# Patient Record
Sex: Female | Born: 1945 | Race: White | Hispanic: No | Marital: Married | Smoking: Former smoker
Health system: Southern US, Community
[De-identification: ages and names within clinical notes are randomized; demographics above are authoritative.]

## PROBLEM LIST (undated history)

## (undated) DIAGNOSIS — R002 Palpitations: Secondary | ICD-10-CM

## (undated) DIAGNOSIS — J449 Chronic obstructive pulmonary disease, unspecified: Secondary | ICD-10-CM

## (undated) DIAGNOSIS — Z8489 Family history of other specified conditions: Secondary | ICD-10-CM

## (undated) DIAGNOSIS — D473 Essential (hemorrhagic) thrombocythemia: Secondary | ICD-10-CM

## (undated) DIAGNOSIS — M7711 Lateral epicondylitis, right elbow: Secondary | ICD-10-CM

## (undated) DIAGNOSIS — R42 Dizziness and giddiness: Secondary | ICD-10-CM

## (undated) DIAGNOSIS — D75839 Thrombocytosis, unspecified: Secondary | ICD-10-CM

## (undated) DIAGNOSIS — I499 Cardiac arrhythmia, unspecified: Secondary | ICD-10-CM

## (undated) DIAGNOSIS — M810 Age-related osteoporosis without current pathological fracture: Secondary | ICD-10-CM

## (undated) DIAGNOSIS — Z972 Presence of dental prosthetic device (complete) (partial): Secondary | ICD-10-CM

## (undated) DIAGNOSIS — I4891 Unspecified atrial fibrillation: Secondary | ICD-10-CM

## (undated) DIAGNOSIS — E559 Vitamin D deficiency, unspecified: Secondary | ICD-10-CM

## (undated) DIAGNOSIS — J45909 Unspecified asthma, uncomplicated: Secondary | ICD-10-CM

## (undated) DIAGNOSIS — I341 Nonrheumatic mitral (valve) prolapse: Secondary | ICD-10-CM

## (undated) DIAGNOSIS — I7300 Raynaud's syndrome without gangrene: Secondary | ICD-10-CM

## (undated) HISTORY — PX: APPENDECTOMY: SHX54

## (undated) HISTORY — DX: Unspecified asthma, uncomplicated: J45.909

## (undated) HISTORY — DX: Nonrheumatic mitral (valve) prolapse: I34.1

## (undated) HISTORY — PX: COLONOSCOPY: SHX174

## (undated) HISTORY — DX: Essential (hemorrhagic) thrombocythemia: D47.3

## (undated) HISTORY — PX: TONSILLECTOMY: SUR1361

## (undated) HISTORY — PX: BLEPHAROPLASTY: SUR158

## (undated) HISTORY — DX: Vitamin D deficiency, unspecified: E55.9

## (undated) HISTORY — DX: Thrombocytosis, unspecified: D75.839

## (undated) HISTORY — PX: TUBAL LIGATION: SHX77

## (undated) HISTORY — DX: Palpitations: R00.2

---

## 2007-03-27 ENCOUNTER — Ambulatory Visit: Payer: Self-pay | Admitting: Internal Medicine

## 2008-06-12 ENCOUNTER — Ambulatory Visit: Payer: Self-pay | Admitting: Internal Medicine

## 2009-08-04 ENCOUNTER — Ambulatory Visit: Payer: Self-pay | Admitting: Nurse Practitioner

## 2010-09-08 ENCOUNTER — Ambulatory Visit: Payer: Self-pay | Admitting: Nurse Practitioner

## 2011-07-01 ENCOUNTER — Ambulatory Visit: Payer: Self-pay | Admitting: Unknown Physician Specialty

## 2011-09-30 ENCOUNTER — Ambulatory Visit: Payer: Self-pay | Admitting: Nurse Practitioner

## 2012-07-16 ENCOUNTER — Emergency Department: Payer: Self-pay | Admitting: Emergency Medicine

## 2012-07-16 LAB — COMPREHENSIVE METABOLIC PANEL
Albumin: 3.9 g/dL (ref 3.4–5.0)
Alkaline Phosphatase: 81 U/L (ref 50–136)
Anion Gap: 6 — ABNORMAL LOW (ref 7–16)
BUN: 11 mg/dL (ref 7–18)
Bilirubin,Total: 0.5 mg/dL (ref 0.2–1.0)
Calcium, Total: 9.2 mg/dL (ref 8.5–10.1)
Chloride: 106 mmol/L (ref 98–107)
Co2: 29 mmol/L (ref 21–32)
Creatinine: 0.72 mg/dL (ref 0.60–1.30)
EGFR (African American): >60
EGFR (Non-African Amer.): >60
Glucose: 90 mg/dL (ref 65–99)
Osmolality: 280 (ref 275–301)
Potassium: 4 mmol/L (ref 3.5–5.1)
SGOT(AST): 27 U/L (ref 15–37)
SGPT (ALT): 20 U/L (ref 12–78)
Sodium: 141 mmol/L (ref 136–145)
Total Protein: 7.1 g/dL (ref 6.4–8.2)

## 2012-07-16 LAB — URINALYSIS, COMPLETE
Bacteria: NONE SEEN
Bilirubin,UR: NEGATIVE
Blood: NEGATIVE
Glucose,UR: NEGATIVE mg/dL (ref 0–75)
Nitrite: NEGATIVE
Ph: 7 (ref 4.5–8.0)
Protein: NEGATIVE
RBC,UR: <1 /HPF (ref 0–5)
Specific Gravity: 1.012 (ref 1.003–1.030)
Squamous Epithelial: 1
WBC UR: 1 /HPF (ref 0–5)

## 2012-07-16 LAB — CBC WITH DIFFERENTIAL/PLATELET
Basophil #: 0.1 10*3/uL (ref 0.0–0.1)
Basophil %: 0.6 %
Eosinophil #: 0 10*3/uL (ref 0.0–0.7)
Eosinophil %: 0.5 %
HCT: 41.6 % (ref 35.0–47.0)
HGB: 13.7 g/dL (ref 12.0–16.0)
Lymphocyte #: 1.3 10*3/uL (ref 1.0–3.6)
Lymphocyte %: 15.2 %
MCH: 30.6 pg (ref 26.0–34.0)
MCHC: 33 g/dL (ref 32.0–36.0)
MCV: 93 fL (ref 80–100)
Monocyte #: 0.4 x10 3/mm (ref 0.2–0.9)
Monocyte %: 4.7 %
Neutrophil #: 6.8 10*3/uL — ABNORMAL HIGH (ref 1.4–6.5)
Neutrophil %: 79 %
Platelet: 221 10*3/uL (ref 150–440)
RBC: 4.48 10*6/uL (ref 3.80–5.20)
RDW: 13.7 % (ref 11.5–14.5)
WBC: 8.6 10*3/uL (ref 3.6–11.0)

## 2012-07-16 LAB — TROPONIN I: Troponin-I: <0.02 ng/mL

## 2012-07-16 LAB — CK TOTAL AND CKMB (NOT AT ARMC)
CK, Total: 45 U/L (ref 21–215)
CK-MB: 0.6 ng/mL (ref 0.5–3.6)

## 2012-07-16 LAB — PROTIME-INR
INR: 0.9
Prothrombin Time: 12 secs (ref 11.5–14.7)

## 2012-11-21 ENCOUNTER — Ambulatory Visit: Payer: Self-pay | Admitting: Nurse Practitioner

## 2013-11-22 ENCOUNTER — Ambulatory Visit: Payer: Self-pay | Admitting: Nurse Practitioner

## 2014-12-02 ENCOUNTER — Ambulatory Visit: Payer: Self-pay | Admitting: Nurse Practitioner

## 2015-06-25 ENCOUNTER — Encounter: Payer: Self-pay | Admitting: *Deleted

## 2015-06-26 ENCOUNTER — Encounter: Payer: Self-pay | Admitting: Anesthesiology

## 2015-06-30 NOTE — Discharge Instructions (Signed)

## 2015-07-01 ENCOUNTER — Ambulatory Visit
Admission: RE | Admit: 2015-07-01 | Discharge: 2015-07-01 | Disposition: A | Discharge status: Home / Self Care | Payer: Medicare Other | Source: Ambulatory Visit | Attending: Ophthalmology | Admitting: Ophthalmology

## 2015-07-01 ENCOUNTER — Encounter
Admission: RE | Disposition: A | Discharge status: Home / Self Care | Payer: Self-pay | Source: Ambulatory Visit | Attending: Ophthalmology

## 2015-07-01 ENCOUNTER — Ambulatory Visit: Payer: Medicare Other | Admitting: Anesthesiology

## 2015-07-01 DIAGNOSIS — J449 Chronic obstructive pulmonary disease, unspecified: Secondary | ICD-10-CM | POA: Insufficient documentation

## 2015-07-01 DIAGNOSIS — H2511 Age-related nuclear cataract, right eye: Secondary | ICD-10-CM | POA: Diagnosis present

## 2015-07-01 DIAGNOSIS — Z87891 Personal history of nicotine dependence: Secondary | ICD-10-CM | POA: Insufficient documentation

## 2015-07-01 HISTORY — DX: Presence of dental prosthetic device (complete) (partial): Z97.2

## 2015-07-01 HISTORY — DX: Chronic obstructive pulmonary disease, unspecified: J44.9

## 2015-07-01 HISTORY — DX: Dizziness and giddiness: R42

## 2015-07-01 HISTORY — PX: CATARACT EXTRACTION W/PHACO: SHX586

## 2015-07-01 HISTORY — DX: Age-related osteoporosis without current pathological fracture: M81.0

## 2015-07-01 HISTORY — DX: Cardiac arrhythmia, unspecified: I49.9

## 2015-07-01 HISTORY — DX: Family history of other specified conditions: Z84.89

## 2015-07-01 SURGERY — PHACOEMULSIFICATION, CATARACT, WITH IOL INSERTION
Anesthesia: Monitor Anesthesia Care | Laterality: Right | Wound class: Clean

## 2015-07-01 MED ORDER — TETRACAINE HCL 0.5 % OP SOLN
1.0000 [drp] | OPHTHALMIC | Status: DC | PRN
  Administered 2015-07-01: 1 [drp] via OPHTHALMIC

## 2015-07-01 MED ORDER — EPINEPHRINE HCL 1 MG/ML IJ SOLN
INTRAOCULAR | Status: DC | PRN
  Administered 2015-07-01: 77 mL via OPHTHALMIC

## 2015-07-01 MED ORDER — BRIMONIDINE TARTRATE 0.2 % OP SOLN
OPHTHALMIC | Status: DC | PRN
  Administered 2015-07-01: 1 [drp] via OPHTHALMIC

## 2015-07-01 MED ORDER — CEFUROXIME OPHTHALMIC INJECTION 1 MG/0.1 ML
INJECTION | OPHTHALMIC | Status: DC | PRN
Start: 2015-07-01 — End: 2015-07-01
  Administered 2015-07-01: 0.1 mL via INTRACAMERAL

## 2015-07-01 MED ORDER — LACTATED RINGERS IV SOLN: INTRAVENOUS | Status: DC

## 2015-07-01 MED ORDER — TIMOLOL MALEATE 0.5 % OP SOLN
OPHTHALMIC | Status: DC | PRN
  Administered 2015-07-01: 1 [drp] via OPHTHALMIC

## 2015-07-01 MED ORDER — ARMC OPHTHALMIC DILATING GEL
1.0000 "application " | OPHTHALMIC | Status: DC | PRN
  Administered 2015-07-01 (×2): 1 via OPHTHALMIC

## 2015-07-01 MED ORDER — NA HYALUR & NA CHOND-NA HYALUR 0.4-0.35 ML IO KIT
PACK | INTRAOCULAR | Status: DC | PRN
  Administered 2015-07-01: 1 mL via INTRAOCULAR

## 2015-07-01 MED ORDER — MIDAZOLAM HCL 2 MG/2ML IJ SOLN
INTRAMUSCULAR | Status: DC | PRN
  Administered 2015-07-01: 2 mg via INTRAVENOUS

## 2015-07-01 MED ORDER — FENTANYL CITRATE (PF) 100 MCG/2ML IJ SOLN
INTRAMUSCULAR | Status: DC | PRN
  Administered 2015-07-01: 50 ug via INTRAVENOUS

## 2015-07-01 MED ORDER — POVIDONE-IODINE 5 % OP SOLN
1.0000 "application " | OPHTHALMIC | Status: DC | PRN
  Administered 2015-07-01: 1 via OPHTHALMIC

## 2015-07-01 SURGICAL SUPPLY — 26 items
CANNULA ANT/CHMB 27GA (MISCELLANEOUS) ×2 IMPLANT
GLOVE SURG LX 7.5 STRW (GLOVE) ×1
GLOVE SURG LX STRL 7.5 STRW (GLOVE) ×1 IMPLANT
GLOVE SURG TRIUMPH 8.0 PF LTX (GLOVE) ×2 IMPLANT
GOWN STRL REUS W/ TWL LRG LVL3 (GOWN DISPOSABLE) ×2 IMPLANT
GOWN STRL REUS W/TWL LRG LVL3 (GOWN DISPOSABLE) ×2
LENS IOL TECNIS 20.5 (Intraocular Lens) ×2 IMPLANT
LENS IOL TECNIS MONO 1P 20.5 (Intraocular Lens) ×1 IMPLANT
MARKER SKIN SURG W/RULER VIO (MISCELLANEOUS) ×2 IMPLANT
NDL RETROBULBAR .5 NSTRL (NEEDLE) IMPLANT
NEEDLE FILTER BLUNT 18X 1/2SAF (NEEDLE) ×1
NEEDLE FILTER BLUNT 18X1 1/2 (NEEDLE) ×1 IMPLANT
PACK CATARACT BRASINGTON (MISCELLANEOUS) ×2 IMPLANT
PACK EYE AFTER SURG (MISCELLANEOUS) ×2 IMPLANT
PACK OPTHALMIC (MISCELLANEOUS) ×2 IMPLANT
RING MALYGIN 7.0 (MISCELLANEOUS) IMPLANT
SUT ETHILON 10-0 CS-B-6CS-B-6 (SUTURE)
SUT VICRYL  9 0 (SUTURE)
SUT VICRYL 9 0 (SUTURE) IMPLANT
SUTURE EHLN 10-0 CS-B-6CS-B-6 (SUTURE) IMPLANT
SYR 3ML LL SCALE MARK (SYRINGE) ×2 IMPLANT
SYR 5ML LL (SYRINGE) IMPLANT
SYR TB 1ML LUER SLIP (SYRINGE) ×2 IMPLANT
WATER STERILE IRR 250ML POUR (IV SOLUTION) ×2 IMPLANT
WATER STERILE IRR 500ML POUR (IV SOLUTION) IMPLANT
WIPE NON LINTING 3.25X3.25 (MISCELLANEOUS) ×2 IMPLANT

## 2015-07-01 NOTE — Anesthesia Postprocedure Evaluation (Addendum)
  Anesthesia Post-op Note  Patient: Gina Mcbride  Procedure(s) Performed: Procedure(s): CATARACT EXTRACTION PHACO AND INTRAOCULAR LENS PLACEMENT (IOC) (Right)  Anesthesia type:MAC  Patient location: PACU  Post pain: Pain level controlled  Post assessment: Post-op Vital signs reviewed, Patient's Cardiovascular Status Stable, Respiratory Function Stable, Patent Airway and No signs of Nausea or vomiting  Post vital signs: Reviewed and stable  Last Vitals:  Filed Vitals:   07/01/15 0845  BP: 104/62  Pulse: 71  Temp:   Resp: 17    Level of consciousness: awake, alert  and patient cooperative  Complications: No apparent anesthesia complications

## 2015-07-01 NOTE — Transfer of Care (Signed)
Immediate Anesthesia Transfer of Care Note  Patient: Gina Mcbride  Procedure(s) Performed: Procedure(s): CATARACT EXTRACTION PHACO AND INTRAOCULAR LENS PLACEMENT (IOC) (Right)  Patient Location: PACU  Anesthesia Type: MAC  Level of Consciousness: awake, alert  and patient cooperative  Airway and Oxygen Therapy: Patient Spontanous Breathing and Patient connected to supplemental oxygen  Post-op Assessment: Post-op Vital signs reviewed, Patient's Cardiovascular Status Stable, Respiratory Function Stable, Patent Airway and No signs of Nausea or vomiting  Post-op Vital Signs: Reviewed and stable  Complications: No apparent anesthesia complications

## 2015-07-01 NOTE — Op Note (Signed)
LOCATION:  [facility]   PREOPERATIVE DIAGNOSIS:    Nuclear sclerotic cataract right eye. H25.11   POSTOPERATIVE DIAGNOSIS:  Nuclear sclerotic cataract right eye.     PROCEDURE:  Phacoemusification with posterior chamber intraocular lens placement of the right eye   LENS:   Implant Name Type Inv. Item Serial No. Manufacturer Lot No. LRB No. Used  LENS IMPL INTRAOC ZCB00 20.5 - [id number] Intraocular Lens LENS IMPL INTRAOC ZCB00 20.5 [id number]   Right 1        ULTRASOUND TIME: 15 % of 1 minutes, 22 seconds.  CDE 12.4   SURGEON:  Wyonia Hough, MD   ANESTHESIA:  Topical with tetracaine drops and 2% Xylocaine jelly.   COMPLICATIONS:  None.   DESCRIPTION OF PROCEDURE:  The patient was identified in the holding room and transported to the operating room and placed in the supine position under the operating microscope.  The right eye was identified as the operative eye and it was prepped and draped in the usual sterile ophthalmic fashion.   A 1 millimeter clear-corneal paracentesis was made at the 12:00 position.  The anterior chamber was filled with Viscoat viscoelastic.  A 2.4 millimeter keratome was used to make a near-clear corneal incision at the 9:00 position.  A curvilinear capsulorrhexis was made with a cystotome and capsulorrhexis forceps.  Balanced salt solution was used to hydrodissect and hydrodelineate the nucleus.   Phacoemulsification was then used in stop and chop fashion to remove the lens nucleus and epinucleus.  The remaining cortex was then removed using the irrigation and aspiration handpiece. Provisc was then placed into the capsular bag to distend it for lens placement.  A lens was then injected into the capsular bag.  The remaining viscoelastic was aspirated.   Wounds were hydrated with balanced salt solution.  The anterior chamber was inflated to a physiologic pressure with balanced salt solution.  No wound leaks were noted. Cefuroxime 0.1 ml of a  10mg /ml solution was injected into the anterior chamber for a dose of 1 mg of intracameral antibiotic at the completion of the case.   Timolol and Brimonidine drops were applied to the eye.  The patient was taken to the recovery room in stable condition without complications of anesthesia or surgery.   Cindy Brindisi 07/01/2015, 8:34 AM

## 2015-07-01 NOTE — Addendum Note (Signed)
Addendum  created 07/01/15 0915 by Ronelle Nigh, MD   Modules edited: Notes Section   Notes Section:  File: [id number]

## 2015-07-01 NOTE — Anesthesia Procedure Notes (Signed)
Procedure Name: MAC Performed by: Rhianon Zabawa Pre-anesthesia Checklist: Patient identified, Emergency Drugs available, Suction available, Timeout performed and Patient being monitored Patient Re-evaluated:Patient Re-evaluated prior to inductionOxygen Delivery Method: Nasal cannula Placement Confirmation: positive ETCO2     

## 2015-07-01 NOTE — Anesthesia Preprocedure Evaluation (Signed)
Anesthesia Evaluation  Patient identified by MRN, date of birth, ID band  Reviewed: Allergy & Precautions, H&P , NPO status , Patient's Chart, lab work & pertinent test results  Airway Mallampati: II  TM Distance: >3 FB Neck ROM: full    Dental no notable dental hx.    Pulmonary COPDformer smoker,    Pulmonary exam normal       Cardiovascular Rhythm:regular Rate:Normal     Neuro/Psych    GI/Hepatic   Endo/Other    Renal/GU      Musculoskeletal   Abdominal   Peds  Hematology   Anesthesia Other Findings   Reproductive/Obstetrics                             Anesthesia Physical Anesthesia Plan  ASA: II  Anesthesia Plan: MAC   Post-op Pain Management:    Induction:   Airway Management Planned:   Additional Equipment:   Intra-op Plan:   Post-operative Plan:   Informed Consent: I have reviewed the patients History and Physical, chart, labs and discussed the procedure including the risks, benefits and alternatives for the proposed anesthesia with the patient or authorized representative who has indicated his/her understanding and acceptance.     Plan Discussed with: CRNA  Anesthesia Plan Comments:         Anesthesia Quick Evaluation

## 2015-07-01 NOTE — H&P (Signed)
  The History and Physical notes were scanned in.  The patient remains stable and unchanged from the H&P.   Previous H&P reviewed, patient examined, and there are no changes.  Gina Mcbride 07/01/2015 8:00 AM

## 2015-07-02 ENCOUNTER — Encounter: Payer: Self-pay | Admitting: Ophthalmology

## 2015-11-17 ENCOUNTER — Other Ambulatory Visit: Payer: Self-pay | Admitting: Nurse Practitioner

## 2015-11-17 DIAGNOSIS — Z1231 Encounter for screening mammogram for malignant neoplasm of breast: Secondary | ICD-10-CM

## 2015-12-04 ENCOUNTER — Ambulatory Visit
Admission: RE | Admit: 2015-12-04 | Discharge: 2015-12-04 | Disposition: A | Discharge status: Home / Self Care | Payer: Medicare Other | Source: Ambulatory Visit | Attending: Nurse Practitioner | Admitting: Nurse Practitioner

## 2015-12-04 ENCOUNTER — Other Ambulatory Visit: Payer: Self-pay | Admitting: Nurse Practitioner

## 2015-12-04 DIAGNOSIS — Z1231 Encounter for screening mammogram for malignant neoplasm of breast: Secondary | ICD-10-CM

## 2015-12-22 ENCOUNTER — Ambulatory Visit
Admission: RE | Admit: 2015-12-22 | Discharge: 2015-12-22 | Disposition: A | Discharge status: Home / Self Care | Payer: Medicare Other | Source: Ambulatory Visit | Attending: Nurse Practitioner | Admitting: Nurse Practitioner

## 2015-12-22 ENCOUNTER — Other Ambulatory Visit: Payer: Self-pay | Admitting: Nurse Practitioner

## 2015-12-22 DIAGNOSIS — M8588 Other specified disorders of bone density and structure, other site: Secondary | ICD-10-CM | POA: Insufficient documentation

## 2015-12-22 DIAGNOSIS — M4184 Other forms of scoliosis, thoracic region: Secondary | ICD-10-CM | POA: Insufficient documentation

## 2015-12-22 DIAGNOSIS — M545 Low back pain: Secondary | ICD-10-CM | POA: Insufficient documentation

## 2015-12-22 DIAGNOSIS — M810 Age-related osteoporosis without current pathological fracture: Secondary | ICD-10-CM

## 2015-12-22 DIAGNOSIS — M5136 Other intervertebral disc degeneration, lumbar region: Secondary | ICD-10-CM | POA: Insufficient documentation

## 2016-11-21 ENCOUNTER — Other Ambulatory Visit: Payer: Self-pay | Admitting: Nurse Practitioner

## 2016-11-21 DIAGNOSIS — Z1231 Encounter for screening mammogram for malignant neoplasm of breast: Secondary | ICD-10-CM

## 2016-12-22 ENCOUNTER — Ambulatory Visit
Admission: RE | Admit: 2016-12-22 | Discharge: 2016-12-22 | Disposition: A | Discharge status: Home / Self Care | Payer: Medicare Other | Source: Ambulatory Visit | Attending: Nurse Practitioner | Admitting: Nurse Practitioner

## 2016-12-22 DIAGNOSIS — Z1231 Encounter for screening mammogram for malignant neoplasm of breast: Secondary | ICD-10-CM | POA: Insufficient documentation

## 2017-11-01 ENCOUNTER — Other Ambulatory Visit: Payer: Self-pay | Admitting: Nurse Practitioner

## 2017-11-02 ENCOUNTER — Other Ambulatory Visit: Payer: Self-pay | Admitting: Nurse Practitioner

## 2017-11-02 DIAGNOSIS — Z1231 Encounter for screening mammogram for malignant neoplasm of breast: Secondary | ICD-10-CM

## 2017-11-09 ENCOUNTER — Encounter: Payer: Self-pay | Admitting: *Deleted

## 2017-11-09 ENCOUNTER — Other Ambulatory Visit: Payer: Self-pay

## 2017-11-13 NOTE — Discharge Instructions (Signed)

## 2017-11-15 ENCOUNTER — Ambulatory Visit
Admission: RE | Admit: 2017-11-15 | Discharge: 2017-11-15 | Disposition: A | Discharge status: Home / Self Care | Payer: Medicare Other | Source: Ambulatory Visit | Attending: Ophthalmology | Admitting: Ophthalmology

## 2017-11-15 ENCOUNTER — Encounter
Admission: RE | Disposition: A | Discharge status: Home / Self Care | Payer: Self-pay | Source: Ambulatory Visit | Attending: Ophthalmology

## 2017-11-15 ENCOUNTER — Ambulatory Visit: Payer: Medicare Other | Admitting: Anesthesiology

## 2017-11-15 DIAGNOSIS — Z87891 Personal history of nicotine dependence: Secondary | ICD-10-CM | POA: Diagnosis not present

## 2017-11-15 DIAGNOSIS — H2512 Age-related nuclear cataract, left eye: Secondary | ICD-10-CM | POA: Insufficient documentation

## 2017-11-15 DIAGNOSIS — Z79899 Other long term (current) drug therapy: Secondary | ICD-10-CM | POA: Insufficient documentation

## 2017-11-15 DIAGNOSIS — M1990 Unspecified osteoarthritis, unspecified site: Secondary | ICD-10-CM | POA: Diagnosis not present

## 2017-11-15 DIAGNOSIS — Z881 Allergy status to other antibiotic agents status: Secondary | ICD-10-CM | POA: Insufficient documentation

## 2017-11-15 DIAGNOSIS — M810 Age-related osteoporosis without current pathological fracture: Secondary | ICD-10-CM | POA: Diagnosis not present

## 2017-11-15 DIAGNOSIS — J449 Chronic obstructive pulmonary disease, unspecified: Secondary | ICD-10-CM | POA: Insufficient documentation

## 2017-11-15 HISTORY — PX: CATARACT EXTRACTION W/PHACO: SHX586

## 2017-11-15 HISTORY — DX: Lateral epicondylitis, right elbow: M77.11

## 2017-11-15 SURGERY — PHACOEMULSIFICATION, CATARACT, WITH IOL INSERTION
Anesthesia: Monitor Anesthesia Care | Site: Eye | Laterality: Left | Wound class: Clean

## 2017-11-15 MED ORDER — BRIMONIDINE TARTRATE-TIMOLOL 0.2-0.5 % OP SOLN
OPHTHALMIC | Status: DC | PRN
  Administered 2017-11-15: 1 [drp] via OPHTHALMIC

## 2017-11-15 MED ORDER — ARMC OPHTHALMIC DILATING DROPS
1.0000 "application " | OPHTHALMIC | Status: DC | PRN
  Administered 2017-11-15 (×3): 1 via OPHTHALMIC

## 2017-11-15 MED ORDER — LIDOCAINE HCL (PF) 2 % IJ SOLN
INTRAOCULAR | Status: DC | PRN
  Administered 2017-11-15: 1 mL via INTRAMUSCULAR

## 2017-11-15 MED ORDER — MOXIFLOXACIN HCL 0.5 % OP SOLN
1.0000 [drp] | OPHTHALMIC | Status: DC | PRN
  Administered 2017-11-15 (×3): 1 [drp] via OPHTHALMIC

## 2017-11-15 MED ORDER — MIDAZOLAM HCL 2 MG/2ML IJ SOLN
INTRAMUSCULAR | Status: DC | PRN
  Administered 2017-11-15 (×2): 1 mg via INTRAVENOUS

## 2017-11-15 MED ORDER — CEFUROXIME OPHTHALMIC INJECTION 1 MG/0.1 ML
INJECTION | OPHTHALMIC | Status: DC | PRN
  Administered 2017-11-15: 0.1 mL via INTRACAMERAL

## 2017-11-15 MED ORDER — LACTATED RINGERS IV SOLN: 10.0000 mL/h | INTRAVENOUS | Status: DC

## 2017-11-15 MED ORDER — FENTANYL CITRATE (PF) 100 MCG/2ML IJ SOLN
INTRAMUSCULAR | Status: DC | PRN
  Administered 2017-11-15 (×2): 50 ug via INTRAVENOUS

## 2017-11-15 MED ORDER — NA HYALUR & NA CHOND-NA HYALUR 0.4-0.35 ML IO KIT
PACK | INTRAOCULAR | Status: DC | PRN
  Administered 2017-11-15: 1 mL via INTRAOCULAR

## 2017-11-15 SURGICAL SUPPLY — 18 items
CANNULA ANT/CHMB 27GA (MISCELLANEOUS) ×2 IMPLANT
GLOVE SURG LX 7.5 STRW (GLOVE) ×1
GLOVE SURG LX STRL 7.5 STRW (GLOVE) ×1 IMPLANT
GLOVE SURG TRIUMPH 8.0 PF LTX (GLOVE) ×2 IMPLANT
GOWN STRL REUS W/ TWL LRG LVL3 (GOWN DISPOSABLE) ×2 IMPLANT
GOWN STRL REUS W/TWL LRG LVL3 (GOWN DISPOSABLE) ×2
LENS IOL TECNIS ITEC 21.5 (Intraocular Lens) ×2 IMPLANT
MARKER SKIN DUAL TIP RULER LAB (MISCELLANEOUS) ×2 IMPLANT
NEEDLE FILTER BLUNT 18X 1/2SAF (NEEDLE) ×1
NEEDLE FILTER BLUNT 18X1 1/2 (NEEDLE) ×1 IMPLANT
PACK CATARACT BRASINGTON (MISCELLANEOUS) ×2 IMPLANT
PACK EYE AFTER SURG (MISCELLANEOUS) ×2 IMPLANT
PACK OPTHALMIC (MISCELLANEOUS) ×2 IMPLANT
SYR 3ML LL SCALE MARK (SYRINGE) ×2 IMPLANT
SYR 5ML LL (SYRINGE) ×2 IMPLANT
SYR TB 1ML LUER SLIP (SYRINGE) ×2 IMPLANT
WATER STERILE IRR 250ML POUR (IV SOLUTION) ×2 IMPLANT
WIPE NON LINTING 3.25X3.25 (MISCELLANEOUS) ×2 IMPLANT

## 2017-11-15 NOTE — Anesthesia Procedure Notes (Signed)
Procedure Name: MAC Performed by: Lind Guest, CRNA Pre-anesthesia Checklist: Patient identified, Emergency Drugs available, Suction available, Patient being monitored and Timeout performed Patient Re-evaluated:Patient Re-evaluated prior to induction Oxygen Delivery Method: Nasal cannula

## 2017-11-15 NOTE — Anesthesia Preprocedure Evaluation (Signed)
Anesthesia Evaluation  Patient identified by MRN, date of birth, ID band Patient awake    Reviewed: Allergy & Precautions, NPO status , Patient's Chart, lab work & pertinent test results  History of Anesthesia Complications Negative for: history of anesthetic complications  Airway Mallampati: II  TM Distance: >3 FB Neck ROM: Full    Dental  (+) Partial Upper, Partial Lower   Pulmonary COPD, former smoker (quit 1996),    Pulmonary exam normal breath sounds clear to auscultation       Cardiovascular Exercise Tolerance: Good negative cardio ROS Normal cardiovascular exam Rhythm:Regular Rate:Normal     Neuro/Psych Vertigo     GI/Hepatic negative GI ROS,   Endo/Other  negative endocrine ROS  Renal/GU negative Renal ROS     Musculoskeletal   Abdominal   Peds  Hematology negative hematology ROS (+)   Anesthesia Other Findings   Reproductive/Obstetrics                             Anesthesia Physical Anesthesia Plan  ASA: II  Anesthesia Plan: MAC   Post-op Pain Management:    Induction: Intravenous  PONV Risk Score and Plan: 2 and TIVA and Midazolam  Airway Management Planned: Natural Airway  Additional Equipment:   Intra-op Plan:   Post-operative Plan:   Informed Consent: I have reviewed the patients History and Physical, chart, labs and discussed the procedure including the risks, benefits and alternatives for the proposed anesthesia with the patient or authorized representative who has indicated his/her understanding and acceptance.     Plan Discussed with: CRNA  Anesthesia Plan Comments:         Anesthesia Quick Evaluation

## 2017-11-15 NOTE — Op Note (Signed)
OPERATIVE NOTE  Gina Mcbride [id number] 11/15/2017   PREOPERATIVE DIAGNOSIS:  Nuclear sclerotic cataract left eye. H25.12   POSTOPERATIVE DIAGNOSIS:    Nuclear sclerotic cataract left eye.     PROCEDURE:  Phacoemusification with posterior chamber intraocular lens placement of the left eye   LENS:   Implant Name Type Inv. Item Serial No. Manufacturer Lot No. LRB No. Used  LENS IOL DIOP 21.5 - [id number] Intraocular Lens LENS IOL DIOP 21.5 [id number] AMO  Left 1        ULTRASOUND TIME: 17  % of 1 minutes 4 seconds, CDE 10.7  SURGEON:  Wyonia Hough, MD   ANESTHESIA:  Topical with tetracaine drops and 2% Xylocaine jelly, augmented with 1% preservative-free intracameral lidocaine.    COMPLICATIONS:  None.   DESCRIPTION OF PROCEDURE:  The patient was identified in the holding room and transported to the operating room and placed in the supine position under the operating microscope.  The left eye was identified as the operative eye and it was prepped and draped in the usual sterile ophthalmic fashion.   A 1 millimeter clear-corneal paracentesis was made at the 1:30 position.  0.5 ml of preservative-free 1% lidocaine was injected into the anterior chamber.  The anterior chamber was filled with Viscoat viscoelastic.  A 2.4 millimeter keratome was used to make a near-clear corneal incision at the 10:30 position.  .  A curvilinear capsulorrhexis was made with a cystotome and capsulorrhexis forceps.  Balanced salt solution was used to hydrodissect and hydrodelineate the nucleus.   Phacoemulsification was then used in stop and chop fashion to remove the lens nucleus and epinucleus.  The remaining cortex was then removed using the irrigation and aspiration handpiece. Provisc was then placed into the capsular bag to distend it for lens placement.  A lens was then injected into the capsular bag.  The remaining viscoelastic was aspirated.   Wounds were hydrated with balanced salt  solution.  The anterior chamber was inflated to a physiologic pressure with balanced salt solution.  No wound leaks were noted. Cefuroxime 0.1 ml of a 10mg /ml solution was injected into the anterior chamber for a dose of 1 mg of intracameral antibiotic at the completion of the case.   Timolol and Brimonidine drops were applied to the eye.  The patient was taken to the recovery room in stable condition without complications of anesthesia or surgery.  [name] 11/15/2017, 11:48 AM

## 2017-11-15 NOTE — Transfer of Care (Signed)
Immediate Anesthesia Transfer of Care Note  Patient: Gina Mcbride  Procedure(s) Performed: CATARACT EXTRACTION PHACO AND INTRAOCULAR LENS PLACEMENT (IOC) LEFT (Left Eye)  Patient Location: PACU  Anesthesia Type: MAC  Level of Consciousness: awake, alert  and patient cooperative  Airway and Oxygen Therapy: Patient Spontanous Breathing and Patient connected to supplemental oxygen  Post-op Assessment: Post-op Vital signs reviewed, Patient's Cardiovascular Status Stable, Respiratory Function Stable, Patent Airway and No signs of Nausea or vomiting  Post-op Vital Signs: Reviewed and stable  Complications: No apparent anesthesia complications

## 2017-11-15 NOTE — H&P (Signed)
The History and Physical notes are on paper, have been signed, and are to be scanned. The patient remains stable and unchanged from the H&P.   Previous H&P reviewed, patient examined, and there are no changes.  Gina Mcbride 11/15/2017 10:50 AM

## 2017-11-15 NOTE — Anesthesia Postprocedure Evaluation (Signed)
Anesthesia Post Note  Patient: Gina Mcbride  Procedure(s) Performed: CATARACT EXTRACTION PHACO AND INTRAOCULAR LENS PLACEMENT ([place]) LEFT (Left Eye)  Patient location during evaluation: PACU Anesthesia Type: MAC Level of consciousness: awake and alert, oriented and patient cooperative Pain management: pain level controlled Vital Signs Assessment: post-procedure vital signs reviewed and stable Respiratory status: spontaneous breathing, nonlabored ventilation and respiratory function stable Cardiovascular status: blood pressure returned to baseline and stable Postop Assessment: adequate PO intake Anesthetic complications: no    Darrin Nipper

## 2017-11-30 DIAGNOSIS — M810 Age-related osteoporosis without current pathological fracture: Secondary | ICD-10-CM | POA: Insufficient documentation

## 2017-11-30 DIAGNOSIS — D75839 Thrombocytosis, unspecified: Secondary | ICD-10-CM | POA: Insufficient documentation

## 2017-12-25 ENCOUNTER — Ambulatory Visit
Admission: RE | Admit: 2017-12-25 | Discharge: 2017-12-25 | Disposition: A | Discharge status: Home / Self Care | Payer: Medicare Other | Source: Ambulatory Visit | Attending: Nurse Practitioner | Admitting: Nurse Practitioner

## 2017-12-25 DIAGNOSIS — Z1231 Encounter for screening mammogram for malignant neoplasm of breast: Secondary | ICD-10-CM | POA: Diagnosis not present

## 2018-01-18 ENCOUNTER — Ambulatory Visit: Payer: Medicare Other | Admitting: Cardiovascular Disease

## 2018-02-14 NOTE — Progress Notes (Signed)
Cardiology Office Note  Date:  02/15/2018   ID:  Mcbride Gina, DOB 05/22/1946, MRN [id number]  PCP:  Renee Rival, NP   Chief Complaint  Patient presents with  . other    Self ref for lightheadedness and elevated heart rate with a history of Barlow's syndrome. Pt. c/o shortness of breath with walking, chest pain that radiates to her back, rapid heart beats and dizziness.  Meds reviewed by the pt. verbally.     HPI:  Ms Gina Mcbride is a 72 year old woman with past medical history of Former smoker, quit 1997 (smoked for 30 yrs), COPD Hyperlipidemia osteoporosis,  Anemia, HCT 32 Insomnia Raynaud's manifestation without gangrene, first episode in 1996,  Who presents by self-referral for symptoms of lightheadedness, tachycardia, mitral valve disease, shortness of breath and chest pain  Numerous issues to discuss today Significant stressors at home, husband getting over cardiac surgery, now at [facility] trying to recover Numerous children,  grandchildren and great-grandchildren to assist with  Having symptoms of: Brief lightheadedness, "very brief" Can happen when she is standing or walking Sometimes has to hold onto something but rarely Happens once every 1 to 2 weeks  Sometimes with tachycardia lasting several seconds Day or night, sometimes  in bed, heart rate fast for several beats Flip flops, and goes away  Told in the past she had mitral valve disease by Dr. Rebecka Mcbride echocardiogram in the past, not available  Rare chest pain, 4 to 5 times, then goes away Sometimes goes to her back When sitting Otherwise good exercise tolerance, no chest pain on exertion  Leg cramping at night Had a before, come back again  Frequently tired Early doing church activities, and taking care of husband  EKG personally reviewed by myself on todays visit Shows normal sinus rhythm with rate 97 bpm frequent PVCs  Lab work reviewed Total cholesterol 202, LDL 108,  triglycerides 79 normal BMP Hematocrit 32   PMH:   has a past medical history of Asthma, Barlow's syndrome, COPD (chronic obstructive pulmonary disease) ([place]), Dysrhythmia, Family history of adverse reaction to anesthesia, Osteoporosis, Palpitations, Right tennis elbow, Thrombocytosis ([place]), Vertigo, Vitamin D deficiency, and Wears dentures.  PSH:    Past Surgical History:  Procedure Laterality Date  . APPENDECTOMY    . BLEPHAROPLASTY    . CATARACT EXTRACTION W/PHACO Right 07/01/2015   Procedure: CATARACT EXTRACTION PHACO AND INTRAOCULAR LENS PLACEMENT ([place]);  Surgeon: Leandrew Koyanagi, MD;  Location: [facility];  Service: Ophthalmology;  Laterality: Right;  . CATARACT EXTRACTION W/PHACO Left 11/15/2017   Procedure: CATARACT EXTRACTION PHACO AND INTRAOCULAR LENS PLACEMENT ([place]) LEFT;  Surgeon: Leandrew Koyanagi, MD;  Location: [facility];  Service: Ophthalmology;  Laterality: Left;  . COLONOSCOPY    . TONSILLECTOMY    . TUBAL LIGATION      Current Outpatient Medications  Medication Sig Dispense Refill  . Calcium Citrate-Vitamin D (CALCIUM + D PO) Take by mouth. lunchtime    . Cholecalciferol (VITAMIN D PO) Take by mouth. lunchtime    . clonazePAM (KLONOPIN) 1 MG tablet Take 1 mg by mouth at bedtime.    Marland Kitchen denosumab (PROLIA) 60 MG/ML SOLN injection Inject 60 mg into the skin every 6 (six) months. Administer in upper arm, thigh, or abdomen     No current facility-administered medications for this visit.      Allergies:   Other; Oxytetracycline; and Shrimp [shellfish allergy]   Social History:  The patient  reports that she quit smoking about 23 years  ago. Her smoking use included cigarettes. She has a 25.00 pack-year smoking history. She has never used smokeless tobacco. She reports that she does not drink alcohol or use drugs.   Family History:   family history includes Breast cancer (age of onset: 33) in her sister.    Review of Systems: Review of Systems   Constitutional: Positive for malaise/fatigue.  Respiratory: Positive for shortness of breath.   Cardiovascular: Positive for chest pain and palpitations.  Gastrointestinal: Negative.   Musculoskeletal: Negative.   Neurological: Positive for dizziness.  Psychiatric/Behavioral: The patient is nervous/anxious and has insomnia.   All other systems reviewed and are negative.    PHYSICAL EXAM: VS:  BP 109/67 (BP Location: Left Arm, Patient Position: Sitting, Cuff Size: Normal)   Pulse 97   Ht 5' 4.5" (1.638 m)   Wt 102 lb 12 oz (46.6 kg)   BMI 17.36 kg/m  , BMI Body mass index is 17.36 kg/m. GEN: Well nourished, well developed, in no acute distress , thin HEENT: normal  Neck: no JVD, carotid bruits, or masses Cardiac: RRR; no murmurs, rubs, or gallops,no edema  Respiratory:  clear to auscultation bilaterally, normal work of breathing GI: soft, nontender, nondistended, + BS MS: no deformity or atrophy  Skin: warm and dry, no rash Neuro:  Strength and sensation are intact Psych: euthymic mood, full affect    Recent Labs: No results found for requested labs within last 8760 hours.    Lipid Panel No results found for: CHOL, HDL, LDLCALC, TRIG    Wt Readings from Last 3 Encounters:  02/15/18 102 lb 12 oz (46.6 kg)  11/15/17 102 lb (46.3 kg)  07/01/15 104 lb (47.2 kg)       ASSESSMENT AND PLAN:  Chest pain, unspecified type - Plan: EKG 12-Lead Atypical chest pain at rest Long discussion with her concerning various treatment options available She does have risk factors for coronary disease but given atypical nature of her symptoms we will hold off on stress testing at her request If symptoms get worse stress testing could be ordered.  We did discuss different types of stress testing including stress echo and nuclear stress test  Lightheaded Orthostatics checked today which were negative Lasting for several seconds every 1 to 2 weeks Suggestive symptoms get worse we  could have her wear event monitor She is having frequent PVCs.  Unable to exclude a pause Recommend she stay hydrated, initial blood pressure was low  Palpitations Likely secondary to PVCs seen on EKG today Lasting several seconds She does not want medication such as beta-blockers Likely exacerbated by stress and insomnia  Mitral valve disease Good exam today with no significant murmur appreciated left axilla She is inclined not to have repeat echocardiogram  Mixed hyperlipidemia Better than average cholesterol  we did discuss other Screening studies such as CT coronary calcium scoring We have written a statin and she will call us if she would like to have this done in the future  History of smoking Stop smoking many years ago, smoked for 30 years Mild COPD  Anemia, unspecified type Etiology unclear, recommend she consider taking iron pill  Raynaud's disease without gangrene Doing better in the warmer weather Very symptomatic in the winter Discussed calcium channel blockers Previously seen by rheumatology who recommended Nitropaste in the webs of her fingers as needed  Shortness of breath Minimal symptoms, recommended regular walking program for conditioning Likely secondary to COPD though unable to exclude ischemia Stress testing if symptoms get worse  Anxiety A lot on her plate Take care of her husband who is recovering from bypass surgery, still in nursing home/rehab Also lots of family to help take care of Insomnia The above is likely contributing to many of her symptoms  Disposition:   F/U as needed   Total encounter time more than 60 minutes  Greater than 50% was spent in counseling and coordination of care with the patient    Orders Placed This Encounter  Procedures  . EKG 12-Lead     Signed, Esmond Plants, M.D., Ph.D. 02/15/2018  [place], [place]

## 2018-02-15 ENCOUNTER — Encounter: Payer: Self-pay | Admitting: Cardiovascular Disease

## 2018-02-15 ENCOUNTER — Ambulatory Visit (INDEPENDENT_AMBULATORY_CARE_PROVIDER_SITE_OTHER): Payer: Medicare Other | Admitting: Cardiovascular Disease

## 2018-02-15 VITALS — BP 109/67 | HR 97 | Ht 64.5 in | Wt 102.8 lb

## 2018-02-15 DIAGNOSIS — R42 Dizziness and giddiness: Secondary | ICD-10-CM | POA: Insufficient documentation

## 2018-02-15 DIAGNOSIS — Z87891 Personal history of nicotine dependence: Secondary | ICD-10-CM | POA: Diagnosis not present

## 2018-02-15 DIAGNOSIS — I059 Rheumatic mitral valve disease, unspecified: Secondary | ICD-10-CM | POA: Diagnosis not present

## 2018-02-15 DIAGNOSIS — E782 Mixed hyperlipidemia: Secondary | ICD-10-CM

## 2018-02-15 DIAGNOSIS — R079 Chest pain, unspecified: Secondary | ICD-10-CM

## 2018-02-15 DIAGNOSIS — R0602 Shortness of breath: Secondary | ICD-10-CM | POA: Diagnosis not present

## 2018-02-15 DIAGNOSIS — R002 Palpitations: Secondary | ICD-10-CM | POA: Diagnosis not present

## 2018-02-15 DIAGNOSIS — F419 Anxiety disorder, unspecified: Secondary | ICD-10-CM | POA: Insufficient documentation

## 2018-02-15 DIAGNOSIS — I7300 Raynaud's syndrome without gangrene: Secondary | ICD-10-CM

## 2018-02-15 DIAGNOSIS — D649 Anemia, unspecified: Secondary | ICD-10-CM

## 2018-02-15 NOTE — Patient Instructions (Addendum)
Medication Instructions:   No medication changes made  Labwork:  No new labs needed  Testing/Procedures:  No further testing at this time  Please call if you would like a monitor for dizziness, for palpitations  Call if you would like a screening test: CT coronary calcium score $150  For chest tightnerss of shortness of breath, we can order a stress test   Follow-Up: It was a pleasure seeing you in the office today. Please call us if you have new issues that need to be addressed before your next appt.  [phone]  Your physician wants you to follow-up in:  As needed  If you need a refill on your cardiac medications before your next appointment, please call your pharmacy.  For educational health videos Log in to : [email or web address] Or : [email or web address], password : [email or web address]

## 2018-05-01 ENCOUNTER — Other Ambulatory Visit: Payer: Self-pay | Admitting: Student

## 2018-05-01 DIAGNOSIS — R1084 Generalized abdominal pain: Secondary | ICD-10-CM

## 2018-05-01 DIAGNOSIS — R1011 Right upper quadrant pain: Secondary | ICD-10-CM

## 2018-05-07 ENCOUNTER — Ambulatory Visit
Admission: RE | Admit: 2018-05-07 | Discharge: 2018-05-07 | Disposition: A | Discharge status: Home / Self Care | Payer: Medicare Other | Source: Ambulatory Visit | Attending: Student | Admitting: Student

## 2018-05-07 DIAGNOSIS — N281 Cyst of kidney, acquired: Secondary | ICD-10-CM | POA: Diagnosis not present

## 2018-05-07 DIAGNOSIS — R1011 Right upper quadrant pain: Secondary | ICD-10-CM | POA: Diagnosis not present

## 2018-05-07 DIAGNOSIS — I700 Atherosclerosis of aorta: Secondary | ICD-10-CM | POA: Diagnosis not present

## 2018-05-07 DIAGNOSIS — R1084 Generalized abdominal pain: Secondary | ICD-10-CM

## 2018-05-08 ENCOUNTER — Other Ambulatory Visit: Payer: Self-pay | Admitting: Student

## 2018-05-08 DIAGNOSIS — R1084 Generalized abdominal pain: Secondary | ICD-10-CM

## 2018-05-08 DIAGNOSIS — R1011 Right upper quadrant pain: Secondary | ICD-10-CM

## 2018-05-17 ENCOUNTER — Ambulatory Visit
Admission: RE | Admit: 2018-05-17 | Discharge: 2018-05-17 | Disposition: A | Discharge status: Home / Self Care | Payer: Medicare Other | Source: Ambulatory Visit | Attending: Student | Admitting: Student

## 2018-05-17 DIAGNOSIS — I700 Atherosclerosis of aorta: Secondary | ICD-10-CM | POA: Diagnosis not present

## 2018-05-17 DIAGNOSIS — R1084 Generalized abdominal pain: Secondary | ICD-10-CM | POA: Insufficient documentation

## 2018-05-17 DIAGNOSIS — R1011 Right upper quadrant pain: Secondary | ICD-10-CM | POA: Insufficient documentation

## 2018-05-17 LAB — POCT I-STAT CREATININE: Creatinine, Ser: 0.7 mg/dL (ref 0.44–1.00)

## 2018-05-17 MED ORDER — IOPAMIDOL (ISOVUE-300) INJECTION 61%
80.0000 mL | Freq: Once | INTRAVENOUS | Status: AC | PRN
  Administered 2018-05-17: 75 mL via INTRAVENOUS

## 2018-06-26 ENCOUNTER — Encounter: Payer: Self-pay | Admitting: *Deleted

## 2018-06-27 ENCOUNTER — Ambulatory Visit: Payer: Medicare Other | Admitting: Anesthesiology

## 2018-06-27 ENCOUNTER — Encounter
Admission: RE | Disposition: A | Discharge status: Home / Self Care | Payer: Self-pay | Source: Ambulatory Visit | Attending: Internal Medicine

## 2018-06-27 ENCOUNTER — Other Ambulatory Visit: Payer: Self-pay

## 2018-06-27 ENCOUNTER — Ambulatory Visit
Admission: RE | Admit: 2018-06-27 | Discharge: 2018-06-27 | Disposition: A | Discharge status: Home / Self Care | Payer: Medicare Other | Source: Ambulatory Visit | Attending: Internal Medicine | Admitting: Internal Medicine

## 2018-06-27 ENCOUNTER — Encounter: Payer: Self-pay | Admitting: *Deleted

## 2018-06-27 DIAGNOSIS — E559 Vitamin D deficiency, unspecified: Secondary | ICD-10-CM | POA: Diagnosis not present

## 2018-06-27 DIAGNOSIS — M810 Age-related osteoporosis without current pathological fracture: Secondary | ICD-10-CM | POA: Diagnosis not present

## 2018-06-27 DIAGNOSIS — I739 Peripheral vascular disease, unspecified: Secondary | ICD-10-CM | POA: Diagnosis not present

## 2018-06-27 DIAGNOSIS — Z79899 Other long term (current) drug therapy: Secondary | ICD-10-CM | POA: Insufficient documentation

## 2018-06-27 DIAGNOSIS — K314 Gastric diverticulum: Secondary | ICD-10-CM | POA: Diagnosis not present

## 2018-06-27 DIAGNOSIS — I7300 Raynaud's syndrome without gangrene: Secondary | ICD-10-CM | POA: Insufficient documentation

## 2018-06-27 DIAGNOSIS — R1011 Right upper quadrant pain: Secondary | ICD-10-CM | POA: Diagnosis not present

## 2018-06-27 DIAGNOSIS — R1084 Generalized abdominal pain: Secondary | ICD-10-CM | POA: Insufficient documentation

## 2018-06-27 DIAGNOSIS — K319 Disease of stomach and duodenum, unspecified: Secondary | ICD-10-CM | POA: Diagnosis not present

## 2018-06-27 DIAGNOSIS — J449 Chronic obstructive pulmonary disease, unspecified: Secondary | ICD-10-CM | POA: Insufficient documentation

## 2018-06-27 DIAGNOSIS — I4891 Unspecified atrial fibrillation: Secondary | ICD-10-CM | POA: Diagnosis not present

## 2018-06-27 DIAGNOSIS — Q438 Other specified congenital malformations of intestine: Secondary | ICD-10-CM | POA: Diagnosis not present

## 2018-06-27 DIAGNOSIS — F419 Anxiety disorder, unspecified: Secondary | ICD-10-CM | POA: Insufficient documentation

## 2018-06-27 DIAGNOSIS — K449 Diaphragmatic hernia without obstruction or gangrene: Secondary | ICD-10-CM | POA: Diagnosis not present

## 2018-06-27 DIAGNOSIS — Z87891 Personal history of nicotine dependence: Secondary | ICD-10-CM | POA: Insufficient documentation

## 2018-06-27 HISTORY — PX: ESOPHAGOGASTRODUODENOSCOPY (EGD) WITH PROPOFOL: SHX5813

## 2018-06-27 HISTORY — PX: COLONOSCOPY WITH PROPOFOL: SHX5780

## 2018-06-27 HISTORY — DX: Raynaud's syndrome without gangrene: I73.00

## 2018-06-27 SURGERY — COLONOSCOPY WITH PROPOFOL
Anesthesia: General

## 2018-06-27 MED ORDER — PHENYLEPHRINE HCL 10 MG/ML IJ SOLN
INTRAMUSCULAR | Status: AC
  Filled 2018-06-27: qty 1

## 2018-06-27 MED ORDER — LIDOCAINE HCL (CARDIAC) PF 100 MG/5ML IV SOSY
PREFILLED_SYRINGE | INTRAVENOUS | Status: DC | PRN
  Administered 2018-06-27: 40 mg via INTRAVENOUS

## 2018-06-27 MED ORDER — PROPOFOL 500 MG/50ML IV EMUL
INTRAVENOUS | Status: DC | PRN
  Administered 2018-06-27: 100 ug/kg/min via INTRAVENOUS

## 2018-06-27 MED ORDER — PROPOFOL 10 MG/ML IV BOLUS
INTRAVENOUS | Status: DC | PRN
  Administered 2018-06-27: 100 mg via INTRAVENOUS

## 2018-06-27 MED ORDER — LIDOCAINE HCL (PF) 2 % IJ SOLN
INTRAMUSCULAR | Status: AC
  Filled 2018-06-27: qty 10

## 2018-06-27 MED ORDER — PHENYLEPHRINE HCL 10 MG/ML IJ SOLN
INTRAMUSCULAR | Status: DC | PRN
  Administered 2018-06-27 (×2): 200 ug via INTRAVENOUS
  Administered 2018-06-27: 100 ug via INTRAVENOUS

## 2018-06-27 MED ORDER — SODIUM CHLORIDE 0.9 % IV SOLN
INTRAVENOUS | Status: DC
  Administered 2018-06-27: 11:00:00 via INTRAVENOUS

## 2018-06-27 MED ORDER — PROPOFOL 500 MG/50ML IV EMUL
INTRAVENOUS | Status: AC
  Filled 2018-06-27: qty 50

## 2018-06-27 NOTE — Op Note (Signed)
[facility] Gastroenterology Patient Name: Gina Mcbride Procedure Date: 06/27/2018 11:54 AM MRN: [id number] Account #: [id number] Date of Birth: May 17, 1946 Admit Type: Outpatient Age: 72 Room: [facility] ENDO ROOM 2 Gender: Female Note Status: Finalized Procedure:            Upper GI endoscopy Indications:          Abdominal pain in the right upper quadrant, Generalized                        abdominal pain Providers:            Benay Pike. Alice Reichert MD, MD Referring MD:         Renee Rival (Referring MD) Medicines:            Propofol per Anesthesia Complications:        No immediate complications. Procedure:            Pre-Anesthesia Assessment:                       - The risks and benefits of the procedure and the                        sedation options and risks were discussed with the                        patient. All questions were answered and informed                        consent was obtained.                       - Patient identification and proposed procedure were                        verified prior to the procedure by the nurse. The                        procedure was verified in the procedure room.                       - ASA Grade Assessment: III - A patient with severe                        systemic disease.                       - After reviewing the risks and benefits, the patient                        was deemed in satisfactory condition to undergo the                        procedure.                       After obtaining informed consent, the endoscope was                        passed under direct vision. Throughout the procedure,  the patient's blood pressure, pulse, and oxygen                        saturations were monitored continuously. The Endoscope                        was introduced through the mouth, and advanced to the                        third part of duodenum. The upper GI endoscopy was                  accomplished without difficulty. Findings:      The examined esophagus was normal.      Patchy mildly erythematous mucosa without bleeding was found in the       gastric antrum. Biopsies were taken with a cold forceps for Helicobacter       pylori testing.      A 1 cm hiatal hernia was present.      The examined duodenum was normal.      The exam was otherwise without abnormality.      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 Impression:           - Normal esophagus.                       - Erythematous mucosa in the antrum. Biopsied.                       - 1 cm hiatal hernia.                       - Normal examined duodenum.                       - The examination was otherwise normal. Recommendation:       - Await pathology results.                       - Proceed with colonoscopy Procedure Code(s):    --- Professional ---                       [phone], Esophagogastroduodenoscopy, flexible, transoral;                        with biopsy, single or multiple Diagnosis Code(s):    --- Professional ---                       R10.84, Generalized abdominal pain                       R10.11, Right upper quadrant pain                       K44.9, Diaphragmatic hernia without obstruction or                        gangrene                       K31.89, Other diseases of stomach and duodenum CPT copyright 2017 American Medical Association. All rights reserved. The codes documented in this report  are preliminary and upon coder review may  be revised to meet current compliance requirements. Efrain Sella MD, MD 06/27/2018 12:13:04 PM This report has been signed electronically. Number of Addenda: 0 Note Initiated On: 06/27/2018 11:54 AM      [facility]

## 2018-06-27 NOTE — Anesthesia Post-op Follow-up Note (Signed)
Anesthesia QCDR form completed.        

## 2018-06-27 NOTE — Anesthesia Preprocedure Evaluation (Signed)
Anesthesia Evaluation  Patient identified by MRN, date of birth, ID band Patient awake    Reviewed: Allergy & Precautions, H&P , NPO status , Patient's Chart, lab work & pertinent test results, reviewed documented beta blocker date and time   Airway Mallampati: II   Neck ROM: full    Dental  (+) Teeth Intact   Pulmonary neg pulmonary ROS, shortness of breath and with exertion, asthma , COPD, former smoker,    Pulmonary exam normal        Cardiovascular Exercise Tolerance: Good + Peripheral Vascular Disease  negative cardio ROS Normal cardiovascular exam+ dysrhythmias Atrial Fibrillation  Rhythm:regular Rate:Normal     Neuro/Psych Anxiety negative neurological ROS  negative psych ROS   GI/Hepatic negative GI ROS, Neg liver ROS,   Endo/Other  negative endocrine ROS  Renal/GU negative Renal ROS  negative genitourinary   Musculoskeletal   Abdominal   Peds  Hematology negative hematology ROS (+) anemia ,   Anesthesia Other Findings Past Medical History: No date: Asthma 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 No date: COPD (chronic obstructive pulmonary disease) (HCC)     Comment:  hx - no issues now No date: Dysrhythmia     Comment:  rare - unnamed - followed by PCP, palpatations No date: Family history of adverse reaction to anesthesia     Comment:  Father - severe swelling after open heart surgery No date: Osteoporosis No date: Palpitations No date: Raynaud's disease without gangrene No date: Right tennis elbow No date: Thrombocytosis (HCC) No date: Vertigo     Comment:  yrs ago - no issues now No date: Vitamin D deficiency No date: Wears dentures     Comment:  full upper, partial lower Past Surgical History: No date: APPENDECTOMY No date: BLEPHAROPLASTY 07/01/2015: CATARACT EXTRACTION W/PHACO; Right     Comment:  Procedure: CATARACT EXTRACTION PHACO AND INTRAOCULAR               LENS PLACEMENT ([place]);  Surgeon:  Leandrew Koyanagi, MD;               Location: [facility];  Service: Ophthalmology;                Laterality: Right; 11/15/2017: CATARACT EXTRACTION W/PHACO; Left     Comment:  Procedure: CATARACT EXTRACTION PHACO AND INTRAOCULAR               LENS PLACEMENT ([place]) LEFT;  Surgeon: Leandrew Koyanagi, MD;  Location: [facility];  Service:               Ophthalmology;  Laterality: Left; No date: COLONOSCOPY No date: TONSILLECTOMY No date: TUBAL LIGATION BMI    Body Mass Index:  17.71 kg/m     Reproductive/Obstetrics negative OB ROS                             Anesthesia Physical Anesthesia Plan  ASA: III  Anesthesia Plan: General   Post-op Pain Management:    Induction:   PONV Risk Score and Plan:   Airway Management Planned:   Additional Equipment:   Intra-op Plan:   Post-operative Plan:   Informed Consent: I have reviewed the patients History and Physical, chart, labs and discussed the procedure including the risks, benefits and alternatives for the proposed anesthesia with the patient or authorized representative who has indicated his/her understanding and acceptance.  Dental Advisory Given  Plan Discussed with: CRNA  Anesthesia Plan Comments: (Pt with syncopal episode last pm.  Brief LOC with no residual impact.  Pt is Alert oriented x 3 and feels lethargic but no HAs or other neurologic sequalae.  EKG requested and ok.  In discussion with pt and [place], I think it reasonable to hydrate the patient and proceed with the above procedure.  JA)        Anesthesia Quick Evaluation

## 2018-06-27 NOTE — Interval H&P Note (Signed)
History and Physical Interval Note:  06/27/2018 10:28 AM  Gina Mcbride  has presented today for surgery, with the diagnosis of ABD PAIN RUQ PAIN  The various methods of treatment have been discussed with the patient and family. After consideration of risks, benefits and other options for treatment, the patient has consented to  Procedure(s): COLONOSCOPY WITH PROPOFOL (N/A) ESOPHAGOGASTRODUODENOSCOPY (EGD) WITH PROPOFOL (N/A) as a surgical intervention .  The patient's history has been reviewed, patient examined, no change in status, stable for surgery.  I have reviewed the patient's chart and labs.  Questions were answered to the patient's satisfaction.     [place], [place]

## 2018-06-27 NOTE — Progress Notes (Signed)
Patient during pre op interview said she was dizzy and weak. She informed me that she felt this was last night and passed out and hit her head getting out of bed to use the bathroom. She said she knew she got out of bed and then came to and realized she was on the for and that she had hit her head on the bed rale. On examination of her head she does have a bruise on the right side of her head near her forehead. Communicated with Dr. Andree Elk and he ordered a 12 lead EKG.

## 2018-06-27 NOTE — H&P (Signed)
Outpatient short stay form Pre-procedure 06/27/2018 10:26 AM Gina Mcbride K. Alice Reichert, M.D.  Primary Physician: Angelina Ok  Reason for visit: Right upper quadrant pain, generalized abdominal pain  History of present illness: 72 year old female complains of intermittent right upper quadrant and generalized abdominal pain.  H. pylori breath test was negative.  Last colonoscopy 2012 showed internal hemorrhoids but was otherwise normal.  Pain is not improved.  Recent CT scan in July 2019 revealed a moderate colonic stool burden and age-related aortic atherosclerosis, but was otherwise negative.   No current facility-administered medications for this encounter.   Medications Prior to Admission  Medication Sig Dispense Refill Last Dose  . omeprazole (PRILOSEC) 40 MG capsule Take 40 mg by mouth daily.     . Calcium Citrate-Vitamin D (CALCIUM + D PO) Take by mouth. lunchtime   Taking  . Cholecalciferol (VITAMIN D PO) Take by mouth. lunchtime   Taking  . clonazePAM (KLONOPIN) 1 MG tablet Take 1 mg by mouth at bedtime.   Taking  . denosumab (PROLIA) 60 MG/ML SOLN injection Inject 60 mg into the skin every 6 (six) months. Administer in upper arm, thigh, or abdomen   Taking     Allergies  Allergen Reactions  . Erythromycin Base   . Other Swelling    Hand contact with raw shrimp  . Oxytetracycline Other (See Comments)    Mouth blisters (all -mycins) Mouth blisters (all -mycins)  . Shrimp [Shellfish Allergy] Swelling    Hand contact with raw shrimp     Past Medical History:  Diagnosis Date  . Asthma   . Barlow's syndrome   . COPD (chronic obstructive pulmonary disease) (HCC)    hx - no issues now  . Dysrhythmia    rare - unnamed - followed by PCP, palpatations  . Family history of adverse reaction to anesthesia    Father - severe swelling after open heart surgery  . Osteoporosis   . Palpitations   . Raynaud's disease without gangrene   . Right tennis elbow   . Thrombocytosis ([place])    . Vertigo    yrs ago - no issues now  . Vitamin D deficiency   . Wears dentures    full upper, partial lower    Review of systems:  Otherwise negative.    Physical Exam  Gen: Alert, oriented. Appears stated age.  HEENT: [place]/AT. PERRLA. Lungs: CTA, no wheezes. CV: RR nl S1, S2. Abd: soft, benign, no masses. BS+ Ext: No edema. Pulses 2+    Planned procedures: Proceed with EGD and colonoscopy. The patient understands the nature of the planned procedure, indications, risks, alternatives and potential complications including but not limited to bleeding, infection, perforation, damage to internal organs and possible oversedation/side effects from anesthesia. The patient agrees and gives consent to proceed.  Please refer to procedure notes for findings, recommendations and patient disposition/instructions.     Lakendrick Paradis K. Alice Reichert, M.D. Gastroenterology 06/27/2018  10:26 AM

## 2018-06-27 NOTE — Transfer of Care (Signed)
Immediate Anesthesia Transfer of Care Note  Patient: Gina Mcbride  Procedure(s) Performed: COLONOSCOPY WITH PROPOFOL (N/A ) ESOPHAGOGASTRODUODENOSCOPY (EGD) WITH PROPOFOL (N/A )  Patient Location: Endoscopy Unit  Anesthesia Type:General  Level of Consciousness: awake, alert , oriented and patient cooperative  Airway & Oxygen Therapy: Patient Spontanous Breathing  Post-op Assessment: Report given to RN, Post -op Vital signs reviewed and stable and Patient moving all extremities  Post vital signs: Reviewed and stable  Last Vitals:  Vitals Value Taken Time  BP 96/51 06/27/2018 12:30 PM  Temp    Pulse 82 06/27/2018 12:31 PM  Resp 21 06/27/2018 12:31 PM  SpO2 99 % 06/27/2018 12:31 PM  Vitals shown include unvalidated device data.  Last Pain:  Vitals:   06/27/18 1048  TempSrc: Tympanic  PainSc: 0-No pain         Complications: No apparent anesthesia complications

## 2018-06-27 NOTE — Op Note (Signed)
[facility]astroenterology Patient Name: Gina Mcbride Procedure Date: 06/27/2018 11:53 AM MRN: [id number] Account #: [id number] Date of Birth: Jul 10, 1946 Admit Type: Outpatient Age: 72 Room: [facility] ENDO ROOM 2 Gender: Female Note Status: Finalized Procedure:            Colonoscopy Indications:          Generalized abdominal pain, Abdominal pain in the right                        upper quadrant Providers:            Benay Pike. Papa Piercefield MD, MD Medicines:            Propofol per Anesthesia Complications:        No immediate complications. Procedure:            Pre-Anesthesia Assessment:                       - The risks and benefits of the procedure and the                        sedation options and risks were discussed with the                        patient. All questions were answered and informed                        consent was obtained.                       - Patient identification and proposed procedure were                        verified prior to the procedure by the nurse. The                        procedure was verified in the procedure room.                       - ASA Grade Assessment: III - A patient with severe                        systemic disease.                       - After reviewing the risks and benefits, the patient                        was deemed in satisfactory condition to undergo the                        procedure.                       After obtaining informed consent, the colonoscope was                        passed under direct vision. Throughout the procedure,                        the patient's blood pressure, pulse, and oxygen  saturations were monitored continuously. The                        Colonoscope was introduced through the anus and                        advanced to the the cecum, identified by appendiceal                        orifice and ileocecal valve. The colonoscopy was               performed without difficulty. The colonoscopy was                        somewhat difficult due to a redundant colon. Successful                        completion of the procedure was aided by applying                        abdominal pressure. The patient tolerated the procedure                        well. The quality of the bowel preparation was                        excellent. Findings:      The perianal and digital rectal examinations were normal. Pertinent       negatives include normal sphincter tone and no palpable rectal lesions.      The colon (entire examined portion) appeared normal.      The entire examined colon appeared normal on direct and retroflexion       views. Impression:           - The entire examined colon is normal.                       - The entire examined colon is normal on direct and                        retroflexion views.                       - No specimens collected. Recommendation:       - Patient has a contact number available for                        emergencies. The signs and symptoms of potential                        delayed complications were discussed with the patient.                        Return to normal activities tomorrow. Written discharge                        instructions were provided to the patient.                       - Await pathology results from EGD, also performed  today.                       - Return to physician assistant in 3 months.                       - Resume previous diet.                       - Continue present medications.                       - Await pathology results. Procedure Code(s):    --- Professional ---                       [phone], Colonoscopy, flexible; diagnostic, including                        collection of specimen(s) by brushing or washing, when                        performed (separate procedure) Diagnosis Code(s):    --- Professional ---                        R10.11, Right upper quadrant pain                       R10.84, Generalized abdominal pain CPT copyright 2017 [employer]. All rights reserved. The codes documented in this report are preliminary and upon coder review may  be revised to meet current compliance requirements. Efrain Sella MD, MD 06/27/2018 12:28:45 PM This report has been signed electronically. Number of Addenda: 0 Note Initiated On: 06/27/2018 11:53 AM Scope Withdrawal Time: 0 hours 5 minutes 6 seconds  Total Procedure Duration: 0 hours 12 minutes 14 seconds       [facility]

## 2018-06-27 NOTE — Anesthesia Postprocedure Evaluation (Signed)
Anesthesia Post Note  Patient: Gina Mcbride  Procedure(s) Performed: COLONOSCOPY WITH PROPOFOL (N/A ) ESOPHAGOGASTRODUODENOSCOPY (EGD) WITH PROPOFOL (N/A )  Patient location during evaluation: Endoscopy Anesthesia Type: General Level of consciousness: awake and alert, oriented and patient cooperative Pain management: satisfactory to patient Vital Signs Assessment: post-procedure vital signs reviewed and stable Respiratory status: spontaneous breathing and respiratory function stable Cardiovascular status: blood pressure returned to baseline and stable Postop Assessment: no headache, no backache, patient able to bend at knees, no apparent nausea or vomiting, adequate PO intake and able to ambulate Anesthetic complications: no     Last Vitals:  Vitals:   06/27/18 1048 06/27/18 1231  BP: 122/82 (!) 96/51  Pulse: (!) 53   Resp: 18   Temp: (!) 35.5 C (!) 36.2 C  SpO2: 100%     Last Pain:  Vitals:   06/27/18 1231  TempSrc: Tympanic  PainSc: Asleep                 Gina Mcbride Annete Ayuso

## 2018-06-28 ENCOUNTER — Encounter: Payer: Self-pay | Admitting: Internal Medicine

## 2018-06-29 LAB — SURGICAL PATHOLOGY

## 2018-07-25 ENCOUNTER — Other Ambulatory Visit: Payer: Self-pay | Admitting: Student

## 2018-07-25 DIAGNOSIS — R140 Abdominal distension (gaseous): Secondary | ICD-10-CM

## 2018-07-25 DIAGNOSIS — R1013 Epigastric pain: Secondary | ICD-10-CM

## 2018-08-06 ENCOUNTER — Ambulatory Visit
Admission: RE | Admit: 2018-08-06 | Discharge: 2018-08-06 | Disposition: A | Discharge status: Home / Self Care | Payer: Medicare Other | Source: Ambulatory Visit | Attending: Student | Admitting: Student

## 2018-08-06 DIAGNOSIS — R1013 Epigastric pain: Secondary | ICD-10-CM | POA: Insufficient documentation

## 2018-08-06 DIAGNOSIS — R140 Abdominal distension (gaseous): Secondary | ICD-10-CM | POA: Diagnosis present

## 2018-08-06 MED ORDER — TECHNETIUM TC 99M SULFUR COLLOID
2.0000 | Freq: Once | INTRAVENOUS | Status: AC | PRN
  Administered 2018-08-06: 1.95 via ORAL

## 2018-11-20 ENCOUNTER — Other Ambulatory Visit: Payer: Self-pay | Admitting: Nurse Practitioner

## 2018-11-20 DIAGNOSIS — Z1231 Encounter for screening mammogram for malignant neoplasm of breast: Secondary | ICD-10-CM

## 2018-12-26 ENCOUNTER — Ambulatory Visit
Admission: RE | Admit: 2018-12-26 | Discharge: 2018-12-26 | Disposition: A | Discharge status: Home / Self Care | Payer: Medicare Other | Source: Ambulatory Visit | Attending: Nurse Practitioner | Admitting: Nurse Practitioner

## 2018-12-26 DIAGNOSIS — Z1231 Encounter for screening mammogram for malignant neoplasm of breast: Secondary | ICD-10-CM

## 2019-03-11 ENCOUNTER — Other Ambulatory Visit: Payer: Self-pay | Admitting: Ophthalmology

## 2019-03-11 DIAGNOSIS — G453 Amaurosis fugax: Secondary | ICD-10-CM

## 2019-03-12 ENCOUNTER — Ambulatory Visit
Admission: RE | Admit: 2019-03-12 | Discharge: 2019-03-12 | Disposition: A | Discharge status: Home / Self Care | Payer: Medicare Other | Source: Ambulatory Visit | Attending: Ophthalmology | Admitting: Ophthalmology

## 2019-03-12 ENCOUNTER — Other Ambulatory Visit: Payer: Self-pay

## 2019-03-12 DIAGNOSIS — G453 Amaurosis fugax: Secondary | ICD-10-CM | POA: Diagnosis present

## 2019-03-19 ENCOUNTER — Ambulatory Visit (INDEPENDENT_AMBULATORY_CARE_PROVIDER_SITE_OTHER): Payer: Medicare Other | Admitting: Vascular Surgery

## 2019-03-19 ENCOUNTER — Encounter (INDEPENDENT_AMBULATORY_CARE_PROVIDER_SITE_OTHER): Payer: Self-pay | Admitting: Vascular Surgery

## 2019-03-19 ENCOUNTER — Other Ambulatory Visit: Payer: Self-pay

## 2019-03-19 VITALS — BP 147/75 | HR 91 | Resp 16 | Ht 64.0 in | Wt 106.8 lb

## 2019-03-19 DIAGNOSIS — Z79899 Other long term (current) drug therapy: Secondary | ICD-10-CM

## 2019-03-19 DIAGNOSIS — G453 Amaurosis fugax: Secondary | ICD-10-CM

## 2019-03-19 DIAGNOSIS — Z7982 Long term (current) use of aspirin: Secondary | ICD-10-CM

## 2019-03-19 DIAGNOSIS — I7300 Raynaud's syndrome without gangrene: Secondary | ICD-10-CM

## 2019-03-19 DIAGNOSIS — E782 Mixed hyperlipidemia: Secondary | ICD-10-CM | POA: Diagnosis not present

## 2019-03-19 DIAGNOSIS — I6523 Occlusion and stenosis of bilateral carotid arteries: Secondary | ICD-10-CM

## 2019-03-19 DIAGNOSIS — Z87891 Personal history of nicotine dependence: Secondary | ICD-10-CM

## 2019-03-19 DIAGNOSIS — I6529 Occlusion and stenosis of unspecified carotid artery: Secondary | ICD-10-CM | POA: Insufficient documentation

## 2019-03-19 NOTE — Assessment & Plan Note (Signed)
lipid control important in reducing the progression of atherosclerotic disease.   

## 2019-03-19 NOTE — Progress Notes (Signed)
Patient ID: Gina Mcbride, female   DOB: 11-04-1945, 73 y.o.   MRN: [id number]  Chief Complaint  Patient presents with  . New Patient (Initial Visit)    ref Ahmed Prima for carotid stenosis    HPI Gina Mcbride is a 73 y.o. female.  I am asked to see the patient by L. Strader, NP for evaluation of carotid artery stenosis.  The patient reports a little over a week ago, she had an episode of left eye blindness.  This came on with no inciting event or causative factor.  There was nothing that she did to made it better but over about a 30-minute span her vision returned to normal.  She was seen by her ophthalmologist and started on aspirin at that time.  She did underwent a carotid duplex for further evaluation. Carotid duplex demonstrates less than 50% carotid artery stenosis bilaterally.  She does not have any arm or leg weakness or numbness.  No speech or swallowing difficulty.  No previous history of stroke or TIA to her knowledge.   Past Medical History:  Diagnosis Date  . Asthma   . Barlow's syndrome   . COPD (chronic obstructive pulmonary disease) (HCC)    hx - no issues now  . Dysrhythmia    rare - unnamed - followed by PCP, palpatations  . Family history of adverse reaction to anesthesia    Father - severe swelling after open heart surgery  . Osteoporosis   . Palpitations   . Raynaud's disease without gangrene   . Right tennis elbow   . Thrombocytosis ([place])   . Vertigo    yrs ago - no issues now  . Vitamin D deficiency   . Wears dentures    full upper, partial lower    Past Surgical History:  Procedure Laterality Date  . APPENDECTOMY    . BLEPHAROPLASTY    . CATARACT EXTRACTION W/PHACO Right 07/01/2015   Procedure: CATARACT EXTRACTION PHACO AND INTRAOCULAR LENS PLACEMENT ([place]);  Surgeon: Leandrew Koyanagi, MD;  Location: [place];  Service: Ophthalmology;  Laterality: Right;  . CATARACT EXTRACTION W/PHACO Left 11/15/2017   Procedure: CATARACT EXTRACTION  PHACO AND INTRAOCULAR LENS PLACEMENT ([place]) LEFT;  Surgeon: Leandrew Koyanagi, MD;  Location: [place];  Service: Ophthalmology;  Laterality: Left;  . COLONOSCOPY    . COLONOSCOPY WITH PROPOFOL N/A 06/27/2018   Procedure: COLONOSCOPY WITH PROPOFOL;  Surgeon: Toledo, Benay Pike, MD;  Location: [facility];  Service: Gastroenterology;  Laterality: N/A;  . ESOPHAGOGASTRODUODENOSCOPY (EGD) WITH PROPOFOL N/A 06/27/2018   Procedure: ESOPHAGOGASTRODUODENOSCOPY (EGD) WITH PROPOFOL;  Surgeon: Toledo, Benay Pike, MD;  Location: [facility];  Service: Gastroenterology;  Laterality: N/A;  . TONSILLECTOMY    . TUBAL LIGATION      Family History  Problem Relation Age of Onset  . Breast cancer Sister 77  no bleeding disorders, clotting disorders, or aneurysms Father had difficulty with anesthesia  Social History Social History   Tobacco Use  . Smoking status: Former Smoker    Packs/day: 1.00    Years: 25.00    Pack years: 25.00    Types: Cigarettes    Last attempt to quit: 10/31/1994    Years since quitting: 24.3  . Smokeless tobacco: Never Used  Substance Use Topics  . Alcohol use: No  . Drug use: Never    Allergies  Allergen Reactions  . Erythromycin Base   . Other Swelling    Hand contact with raw shrimp  . Oxytetracycline Other (  See Comments)    Mouth blisters (all -mycins) Mouth blisters (all -mycins)  . Shrimp [Shellfish Allergy] Swelling    Hand contact with raw shrimp    Current Outpatient Medications  Medication Sig Dispense Refill  . aspirin 325 MG EC tablet Take 325 mg by mouth daily.    . Calcium Citrate-Vitamin D (CALCIUM + D PO) Take by mouth. lunchtime    . Cholecalciferol (VITAMIN D PO) Take by mouth. lunchtime    . clonazePAM (KLONOPIN) 1 MG tablet Take 1 mg by mouth at bedtime.    Marland Kitchen denosumab (PROLIA) 60 MG/ML SOLN injection Inject 60 mg into the skin every 6 (six) months. Administer in upper arm, thigh, or abdomen    . nitroGLYCERIN (NITRO-BID) 2 %  ointment Nitro-Bid 2 % transdermal ointment    . omeprazole (PRILOSEC) 40 MG capsule Take 40 mg by mouth daily.     No current facility-administered medications for this visit.       REVIEW OF SYSTEMS (Negative unless checked)  Constitutional: [] Weight loss  [] Fever  [] Chills Cardiac: [] Chest pain   [] Chest pressure   [] Palpitations   [] Shortness of breath when laying flat   [] Shortness of breath at rest   [] Shortness of breath with exertion. Vascular:  [] Pain in legs with walking   [] Pain in legs at rest   [] Pain in legs when laying flat   [] Claudication   [] Pain in feet when walking  [] Pain in feet at rest  [] Pain in feet when laying flat   [] History of DVT   [] Phlebitis   [] Swelling in legs   [] Varicose veins   [] Non-healing ulcers Pulmonary:   [] Uses home oxygen   [] Productive cough   [] Hemoptysis   [] Wheeze  [x] COPD   [] Asthma Neurologic:  [] Dizziness  [] Blackouts   [] Seizures   [] History of stroke   [] History of TIA  [] Aphasia   [x] Temporary blindness   [] Dysphagia   [] Weakness or numbness in arms   [] Weakness or numbness in legs Musculoskeletal:  [x] Arthritis   [] Joint swelling   [x] Joint pain   [] Low back pain Hematologic:  [] Easy bruising  [] Easy bleeding   [] Hypercoagulable state   [x] Anemic  [] Hepatitis Gastrointestinal:  [] Blood in stool   [] Vomiting blood  [x] Gastroesophageal reflux/heartburn   [] Abdominal pain Genitourinary:  [] Chronic kidney disease   [] Difficult urination  [] Frequent urination  [] Burning with urination   [] Hematuria Skin:  [] Rashes   [] Ulcers   [] Wounds Psychological:  [] History of anxiety   []  History of major depression.    Physical Exam BP (!) 147/75 (BP Location: Left Arm)   Pulse 91   Resp 16   Ht 5\' 4"  (1.626 m)   Wt 106 lb 12.8 oz (48.4 kg)   BMI 18.33 kg/m  Gen:  Thin, NAD.  Head: [place]/AT, No temporalis wasting. Ear/Nose/Throat: Hearing grossly intact, nares w/o erythema or drainage, oropharynx w/o Erythema/Exudate Eyes: Conjunctiva clear,  sclera non-icteric  Neck: trachea midline.  No bruit or JVD.  Pulmonary:  Good air movement, clear to auscultation bilaterally.  Cardiac: RRR, normal S1, S2 Vascular:  Vessel Right Left  Radial Palpable Palpable                          PT Palpable Palpable  DP Palpable Palpable   Gastrointestinal: soft, non-tender/non-distended.  Musculoskeletal: M/S 5/5 throughout.  Extremities without ischemic changes.  No deformity or atrophy. No edema. Neurologic: Sensation grossly intact in extremities.  Symmetrical.  Speech is fluent.  Motor exam as listed above. Psychiatric: Judgment intact, Mood & affect appropriate for pt's clinical situation. Dermatologic: No rashes or ulcers noted.  No cellulitis or open wounds.   Radiology US Carotid Bilateral  Result Date: 03/12/2019 CLINICAL DATA:  Left eye amaurosis fugax. EXAM: BILATERAL CAROTID DUPLEX ULTRASOUND TECHNIQUE: Pearline Cables scale imaging, color Doppler and duplex ultrasound were performed of bilateral carotid and vertebral arteries in the neck. COMPARISON:  None. FINDINGS: Criteria: Quantification of carotid stenosis is based on velocity parameters that correlate the residual internal carotid diameter with NASCET-based stenosis levels, using the diameter of the distal internal carotid lumen as the denominator for stenosis measurement. The following velocity measurements were obtained: RIGHT ICA: 119/29 cm/sec CCA: 939/03 cm/sec SYSTOLIC ICA/CCA RATIO:  0.9 ECA: 153 cm/sec LEFT ICA: 131/19 cm/sec CCA: 009/23 cm/sec SYSTOLIC ICA/CCA RATIO:  1.3 ECA: 117 cm/sec RIGHT CAROTID ARTERY: Circumferential intimal thickening in the right common carotid artery. Small amount of plaque at the right carotid bulb. External carotid artery is patent with normal waveform. Small amount of echogenic plaque in the proximal internal carotid artery without significant stenosis. Normal waveforms and velocities in the internal carotid artery. RIGHT VERTEBRAL ARTERY: Antegrade  flow and normal waveform in the right vertebral artery. LEFT CAROTID ARTERY: Intimal thickening in the left common carotid artery. Small amount of calcified plaque at the left carotid bulb. External carotid artery is patent with normal waveform. Calcified echogenic plaque in the proximal internal carotid artery. Peak systolic velocity in the proximal internal carotid artery is minimally elevated measuring to 131 cm/sec and there may be some turbulent flow in this region. Left carotid artery ratio is less than 2 and within normal limits. LEFT VERTEBRAL ARTERY: Antegrade flow and normal waveform in the left vertebral artery. IMPRESSION: Intimal thickening and atherosclerotic plaque in the bilateral carotid arteries. Specifically, there is atherosclerotic plaque in the internal carotid arteries bilaterally. Estimated degree of stenosis in the internal carotid arteries is less than 50% bilaterally. Patent vertebral arteries with antegrade flow. Electronically Signed   By: Markus Daft M.D.   On: 03/12/2019 15:24    Labs No results found for this or any previous visit (from the past 2160 hour(s)).  Assessment/Plan:  Mixed hyperlipidemia lipid control important in reducing the progression of atherosclerotic disease.     Raynaud's disease without gangrene Symptoms fairly mild at this point  Carotid stenosis Carotid duplex demonstrates less than 50% carotid artery stenosis bilaterally.  This could have still been the cause of her amaurosis fugax even with mild disease.  She was started on aspirin a week ago and continues on this.  I would recommend continued aspirin.  It is also possible that she had some sort of temporary cardiac arrhythmia and had a small embolus from that.  Fortunately, she does not have permanent deficits from her event.  I will plan to see her back in about 6 months with carotid duplex but will be happy to see her sooner if any problems occur in the interim.  Amaurosis fugax Patient  describes a clear amaurosis fugax event a little over a week ago.  Her carotid disease is mild.  She does have a history of cardiac arrhythmias in the past.  I would recommend an aspirin daily.  No intervention would be recommended for her carotid disease.      Leotis Pain 03/19/2019, 2:18 PM   This note was created with Dragon medical transcription system.  Any errors from dictation are unintentional.

## 2019-03-19 NOTE — Assessment & Plan Note (Signed)
Patient describes a clear amaurosis fugax event a little over a week ago.  Her carotid disease is mild.  She does have a history of cardiac arrhythmias in the past.  I would recommend an aspirin daily.  No intervention would be recommended for her carotid disease.

## 2019-03-19 NOTE — Assessment & Plan Note (Signed)
Symptoms fairly mild at this point

## 2019-03-19 NOTE — Patient Instructions (Signed)
Carotid Artery Disease  The carotid arteries are arteries on both sides of the neck. They carry blood to the brain, face, and neck. Carotid artery disease happens when these arteries become smaller (narrow) or get blocked. If these arteries become smaller or get blocked, you are more likely to have a stroke or a warning stroke (transient ischemic attack). Follow these instructions at home:  Take over-the-counter and prescription medicines only as told by your doctor.  Make sure you understand all instructions about your medicines. Do not stop taking your medicines without talking to your doctor first.  Follow your doctor's diet instructions. It is important to follow a healthy diet. ? Eat foods that include plenty of: ? Fresh fruits. ? Vegetables. ? Lean meats. ? Avoid these foods: ? Foods that are high in fat. ? Foods that are high in salt (sodium). ? Foods that are fried. ? Foods that are processed. ? Foods that have few good nutrients (poor nutritional value).  Keep a healthy weight.  Stay active. Get at least 30 minutes of activity every day.  Do not smoke.  Limit alcohol use to: ? No more than 2 drinks a day for men. ? No more than 1 drink a day for women who are not pregnant.  Do not use illegal drugs.  Keep all follow-up visits as told by your doctor. This is important. Contact a doctor if: Get help right away if:  You have any symptoms of stroke or TIA. The acronym BEFAST is an easy way to remember the main warning signs of stroke. ? B = Balance problems. Signs include dizziness, sudden trouble walking, or loss of balance ? E = Eye problems. This includes trouble seeing or a sudden change in vision. ? F = Face changes. This includes sudden weakness or numbness of the face, or the face or eyelid drooping to one side. ? A = Arm weakness or numbness. This happens suddenly and usually on one side of the body. ? S = Speech problems. This includes trouble speaking or  trouble understanding. ? T = Time. Time to call 911 or seek emergency care. Do not wait to see if symptoms go away. Make note of the time your symptoms started.  Other signs of stroke may include: ? A sudden, severe headache with no known cause. ? Feeling sick to your stomach (nauseous) or throwing up (vomiting). ? Seizure. Call your local emergency services (911 in [place] Do notdrive yourself to the clinic or hospital. Summary  The carotid arteries are arteries on both sides of the neck.  If these arteries get smaller or get blocked, you are more likely to have a stroke or a warning stroke (transient ischemic attack).  Take over-the-counter and prescription medicines only as told by your doctor.  Keep all follow-up visits as told by your doctor. This is important. This information is not intended to replace advice given to you by your health care provider. Make sure you discuss any questions you have with your health care provider. Document Released: 10/03/2012 Document Revised: 10/12/2017 Document Reviewed: 10/12/2017 Elsevier Interactive Patient Education  2019 Elsevier Inc[employer]  

## 2019-03-19 NOTE — Assessment & Plan Note (Signed)
Carotid duplex demonstrates less than 50% carotid artery stenosis bilaterally.  This could have still been the cause of her amaurosis fugax even with mild disease.  She was started on aspirin a week ago and continues on this.  I would recommend continued aspirin.  It is also possible that she had some sort of temporary cardiac arrhythmia and had a small embolus from that.  Fortunately, she does not have permanent deficits from her event.  I will plan to see her back in about 6 months with carotid duplex but will be happy to see her sooner if any problems occur in the interim.

## 2019-06-26 ENCOUNTER — Other Ambulatory Visit: Payer: Self-pay | Admitting: Student

## 2019-06-26 DIAGNOSIS — G8929 Other chronic pain: Secondary | ICD-10-CM

## 2019-06-26 DIAGNOSIS — M533 Sacrococcygeal disorders, not elsewhere classified: Secondary | ICD-10-CM

## 2019-06-26 DIAGNOSIS — M545 Low back pain, unspecified: Secondary | ICD-10-CM

## 2019-07-06 ENCOUNTER — Ambulatory Visit
Admission: RE | Admit: 2019-07-06 | Discharge: 2019-07-06 | Disposition: A | Discharge status: Home / Self Care | Payer: Medicare Other | Source: Ambulatory Visit | Attending: Student | Admitting: Student

## 2019-07-06 ENCOUNTER — Other Ambulatory Visit: Payer: Self-pay

## 2019-07-06 DIAGNOSIS — M533 Sacrococcygeal disorders, not elsewhere classified: Secondary | ICD-10-CM | POA: Diagnosis present

## 2019-07-06 DIAGNOSIS — G8929 Other chronic pain: Secondary | ICD-10-CM | POA: Diagnosis present

## 2019-07-06 DIAGNOSIS — M545 Low back pain: Secondary | ICD-10-CM | POA: Diagnosis present

## 2019-09-18 DIAGNOSIS — M47816 Spondylosis without myelopathy or radiculopathy, lumbar region: Secondary | ICD-10-CM | POA: Insufficient documentation

## 2019-09-20 ENCOUNTER — Ambulatory Visit (INDEPENDENT_AMBULATORY_CARE_PROVIDER_SITE_OTHER): Payer: Medicare Other | Admitting: Vascular Surgery

## 2019-09-20 ENCOUNTER — Encounter (INDEPENDENT_AMBULATORY_CARE_PROVIDER_SITE_OTHER): Payer: Medicare Other

## 2019-10-18 ENCOUNTER — Other Ambulatory Visit: Payer: Self-pay

## 2019-10-18 ENCOUNTER — Ambulatory Visit (INDEPENDENT_AMBULATORY_CARE_PROVIDER_SITE_OTHER): Payer: Medicare Other | Admitting: Vascular Surgery

## 2019-10-18 ENCOUNTER — Encounter (INDEPENDENT_AMBULATORY_CARE_PROVIDER_SITE_OTHER): Payer: Self-pay

## 2019-10-18 ENCOUNTER — Ambulatory Visit (INDEPENDENT_AMBULATORY_CARE_PROVIDER_SITE_OTHER): Payer: Medicare Other

## 2019-10-18 ENCOUNTER — Encounter (INDEPENDENT_AMBULATORY_CARE_PROVIDER_SITE_OTHER): Payer: Self-pay | Admitting: Vascular Surgery

## 2019-10-18 VITALS — BP 158/80 | HR 81 | Resp 16 | Wt 108.0 lb

## 2019-10-18 DIAGNOSIS — I6523 Occlusion and stenosis of bilateral carotid arteries: Secondary | ICD-10-CM | POA: Diagnosis not present

## 2019-10-18 DIAGNOSIS — I7300 Raynaud's syndrome without gangrene: Secondary | ICD-10-CM | POA: Diagnosis not present

## 2019-10-18 DIAGNOSIS — E782 Mixed hyperlipidemia: Secondary | ICD-10-CM

## 2019-10-18 DIAGNOSIS — M810 Age-related osteoporosis without current pathological fracture: Secondary | ICD-10-CM | POA: Insufficient documentation

## 2019-10-18 NOTE — Progress Notes (Signed)
MRN : [id number]  Gina Mcbride is a 73 y.o. (01-28-1946) female who presents with chief complaint of  Chief Complaint  Patient presents with  . Follow-up    ultrasound follow up  .  History of Present Illness: She returns in follow-up of her carotid disease.  She had an episode of amaurosis fugax that prompted her original referral but she has had no further episodes of visual changes.  She does have some occasional lightheadedness but no arm or leg weakness or numbness, speech or swallowing difficulty, or new visual symptoms.  Her duplex today shows mild 1 to 39% carotid artery stenosis bilaterally without significant progression from her previous study.  Current Outpatient Medications  Medication Sig Dispense Refill  . aspirin 325 MG EC tablet Take 325 mg by mouth daily.    . Calcium Citrate-Vitamin D (CALCIUM + D PO) Take by mouth. lunchtime    . Cholecalciferol (VITAMIN D PO) Take by mouth. lunchtime    . clonazePAM (KLONOPIN) 1 MG tablet Take 1 mg by mouth at bedtime.    Marland Kitchen denosumab (PROLIA) 60 MG/ML SOLN injection Inject 60 mg into the skin every 6 (six) months. Administer in upper arm, thigh, or abdomen    . nitroGLYCERIN (NITRO-BID) 2 % ointment Nitro-Bid 2 % transdermal ointment    . omeprazole (PRILOSEC) 40 MG capsule Take 40 mg by mouth daily.     No current facility-administered medications for this visit.    Past Medical History:  Diagnosis Date  . Asthma   . Barlow's syndrome   . COPD (chronic obstructive pulmonary disease) (HCC)    hx - no issues now  . Dysrhythmia    rare - unnamed - followed by PCP, palpatations  . Family history of adverse reaction to anesthesia    Father - severe swelling after open heart surgery  . Osteoporosis   . Palpitations   . Raynaud's disease without gangrene   . Right tennis elbow   . 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 Vertigo    yrs ago - no issues now  . Vitamin D deficiency   . Wears dentures    full upper, partial lower     Past Surgical History:  Procedure Laterality Date  . APPENDECTOMY    . BLEPHAROPLASTY    . CATARACT EXTRACTION W/PHACO Right 07/01/2015   Procedure: CATARACT EXTRACTION PHACO AND INTRAOCULAR LENS PLACEMENT ([place]);  Surgeon: Leandrew Koyanagi, MD;  Location: [place];  Service: Ophthalmology;  Laterality: Right;  . CATARACT EXTRACTION W/PHACO Left 11/15/2017   Procedure: CATARACT EXTRACTION PHACO AND INTRAOCULAR LENS PLACEMENT ([place]) LEFT;  Surgeon: Leandrew Koyanagi, MD;  Location: [place];  Service: Ophthalmology;  Laterality: Left;  . COLONOSCOPY    . COLONOSCOPY WITH PROPOFOL N/A 06/27/2018   Procedure: COLONOSCOPY WITH PROPOFOL;  Surgeon: Toledo, Benay Pike, MD;  Location: [facility];  Service: Gastroenterology;  Laterality: N/A;  . ESOPHAGOGASTRODUODENOSCOPY (EGD) WITH PROPOFOL N/A 06/27/2018   Procedure: ESOPHAGOGASTRODUODENOSCOPY (EGD) WITH PROPOFOL;  Surgeon: Toledo, Benay Pike, MD;  Location: [facility];  Service: Gastroenterology;  Laterality: N/A;  . TONSILLECTOMY    . TUBAL LIGATION      Social History Social History   Tobacco Use  . Smoking status: Former Smoker    Packs/day: 1.00    Years: 25.00    Pack years: 25.00    Types: Cigarettes    Quit date: 10/31/1994    Years since quitting: 24.9  . Smokeless tobacco: Never Used  Substance Use Topics  .  Alcohol use: No  . Drug use: Never    Family History  Problem Relation Age of Onset  . Breast cancer Sister 50  No bleeding or clotting disorders  Allergies  Allergen Reactions  . Erythromycin Base   . Other Swelling    Hand contact with raw shrimp  . Oxytetracycline Other (See Comments)    Mouth blisters (all -mycins) Mouth blisters (all -mycins)  . Shrimp [Shellfish Allergy] Swelling    Hand contact with raw shrimp    REVIEW OF SYSTEMS (Negative unless checked)  Constitutional: [] ?Weight loss  [] ?Fever  [] ?Chills Cardiac: [] ?Chest pain   [] ?Chest pressure    [] ?Palpitations   [] ?Shortness of breath when laying flat   [] ?Shortness of breath at rest   [] ?Shortness of breath with exertion. Vascular:  [] ?Pain in legs with walking   [] ?Pain in legs at rest   [] ?Pain in legs when laying flat   [] ?Claudication   [] ?Pain in feet when walking  [] ?Pain in feet at rest  [] ?Pain in feet when laying flat   [] ?History of DVT   [] ?Phlebitis   [] ?Swelling in legs   [] ?Varicose veins   [] ?Non-healing ulcers Pulmonary:   [] ?Uses home oxygen   [] ?Productive cough   [] ?Hemoptysis   [] ?Wheeze  [x] ?COPD   [] ?Asthma Neurologic:  [x] ?Dizziness  [] ?Blackouts   [] ?Seizures   [] ?History of stroke   [] ?History of TIA  [] ?Aphasia   [x] ?Temporary blindness   [] ?Dysphagia   [] ?Weakness or numbness in arms   [] ?Weakness or numbness in legs Musculoskeletal:  [x] ?Arthritis   [] ?Joint swelling   [x] ?Joint pain   [] ?Low back pain Hematologic:  [] ?Easy bruising  [] ?Easy bleeding   [] ?Hypercoagulable state   [x] ?Anemic  [] ?Hepatitis Gastrointestinal:  [] ?Blood in stool   [] ?Vomiting blood  [x] ?Gastroesophageal reflux/heartburn   [] ?Abdominal pain Genitourinary:  [] ?Chronic kidney disease   [] ?Difficult urination  [] ?Frequent urination  [] ?Burning with urination   [] ?Hematuria Skin:  [] ?Rashes   [] ?Ulcers   [] ?Wounds Psychological:  [] ?History of anxiety   [] ? History of major depression.  Physical Examination  Vitals:   10/18/19 1107 10/18/19 1108  BP: (!) 155/78 (!) 158/80  Pulse: 81   Resp: 16   Weight: 108 lb (49 kg)    Body mass index is 18.54 kg/m. Gen:  WD/WN, NAD. Appears younger than stated age.  Head: [place]/AT, No temporalis wasting. Ear/Nose/Throat: Hearing grossly intact, nares w/o erythema or drainage, trachea midline Eyes: Conjunctiva clear. Sclera non-icteric Neck: Supple.  No carotid bruit  Pulmonary:  Good air movement, equal and clear to auscultation bilaterally.  Cardiac: RRR, No JVD Vascular:  Vessel Right Left  Radial Palpable Palpable                Musculoskeletal: M/S 5/5 throughout.  No deformity or atrophy. No edema. Neurologic: CN 2-12 intact. Sensation grossly intact in extremities.  Symmetrical.  Speech is fluent. Motor exam as listed above. Psychiatric: Judgment intact, Mood & affect appropriate for pt's clinical situation. Dermatologic: No rashes or ulcers noted.  No cellulitis or open wounds.      CBC Lab Results  Component Value Date   WBC 8.6 07/16/2012   HGB 13.7 07/16/2012   HCT 41.6 07/16/2012   MCV 93 07/16/2012   PLT 221 07/16/2012    BMET    Component Value Date/Time   NA 141 07/16/2012 1202   K 4.0 07/16/2012 1202   CL 106 07/16/2012 1202   CO2 29 07/16/2012 1202   GLUCOSE 90 07/16/2012 1202  BUN 11 07/16/2012 1202   CREATININE 0.70 05/17/2018 0842   CREATININE 0.72 07/16/2012 1202   CALCIUM 9.2 07/16/2012 1202   GFRNONAA >60 07/16/2012 1202   GFRAA >60 07/16/2012 1202   CrCl cannot be calculated (Patient's most recent lab result is older than the maximum 21 days allowed.).  COAG Lab Results  Component Value Date   INR 0.9 07/16/2012    Radiology No results found.    Assessment/Plan Mixed hyperlipidemia lipid control important in reducing the progression of atherosclerotic disease.     Raynaud's disease without gangrene Symptoms fairly mild at this point  Carotid stenosis Her duplex today shows mild 1 to 39% carotid artery stenosis bilaterally without significant progression from her previous study. No change in medical regimen at this time other than an aspirin daily.  Follow-up on an annual basis for now.    Leotis Pain, MD  10/18/2019 11:41 AM    This note was created with Dragon medical transcription system.  Any errors from dictation are purely unintentional

## 2019-10-18 NOTE — Assessment & Plan Note (Signed)
Her duplex today shows mild 1 to 39% carotid artery stenosis bilaterally without significant progression from her previous study. No change in medical regimen at this time other than an aspirin daily.  Follow-up on an annual basis for now.

## 2019-12-02 ENCOUNTER — Other Ambulatory Visit: Payer: Self-pay | Admitting: Nurse Practitioner

## 2019-12-02 DIAGNOSIS — Z1231 Encounter for screening mammogram for malignant neoplasm of breast: Secondary | ICD-10-CM

## 2019-12-31 ENCOUNTER — Ambulatory Visit
Admission: RE | Admit: 2019-12-31 | Discharge: 2019-12-31 | Disposition: A | Discharge status: Home / Self Care | Payer: Medicare Other | Source: Ambulatory Visit | Attending: Nurse Practitioner | Admitting: Nurse Practitioner

## 2019-12-31 DIAGNOSIS — Z1231 Encounter for screening mammogram for malignant neoplasm of breast: Secondary | ICD-10-CM | POA: Diagnosis not present

## 2020-03-13 ENCOUNTER — Ambulatory Visit (INDEPENDENT_AMBULATORY_CARE_PROVIDER_SITE_OTHER): Payer: Medicare Other | Admitting: Internal Medicine

## 2020-03-13 ENCOUNTER — Other Ambulatory Visit
Admission: RE | Admit: 2020-03-13 | Discharge: 2020-03-13 | Disposition: A | Discharge status: Home / Self Care | Payer: Medicare Other | Source: Ambulatory Visit | Attending: Internal Medicine | Admitting: Internal Medicine

## 2020-03-13 ENCOUNTER — Telehealth: Payer: Self-pay | Admitting: Cardiovascular Disease

## 2020-03-13 ENCOUNTER — Telehealth (INDEPENDENT_AMBULATORY_CARE_PROVIDER_SITE_OTHER): Payer: Self-pay

## 2020-03-13 ENCOUNTER — Encounter: Payer: Self-pay | Admitting: Internal Medicine

## 2020-03-13 ENCOUNTER — Ambulatory Visit (INDEPENDENT_AMBULATORY_CARE_PROVIDER_SITE_OTHER): Payer: Medicare Other

## 2020-03-13 ENCOUNTER — Other Ambulatory Visit: Payer: Self-pay

## 2020-03-13 VITALS — BP 138/80 | HR 93 | Ht 64.0 in | Wt 108.5 lb

## 2020-03-13 DIAGNOSIS — R0789 Other chest pain: Secondary | ICD-10-CM | POA: Diagnosis not present

## 2020-03-13 DIAGNOSIS — I341 Nonrheumatic mitral (valve) prolapse: Secondary | ICD-10-CM

## 2020-03-13 DIAGNOSIS — R0600 Dyspnea, unspecified: Secondary | ICD-10-CM

## 2020-03-13 DIAGNOSIS — R42 Dizziness and giddiness: Secondary | ICD-10-CM

## 2020-03-13 DIAGNOSIS — R0609 Other forms of dyspnea: Secondary | ICD-10-CM

## 2020-03-13 DIAGNOSIS — R002 Palpitations: Secondary | ICD-10-CM | POA: Insufficient documentation

## 2020-03-13 LAB — COMPREHENSIVE METABOLIC PANEL
ALT: 16 U/L (ref 0–44)
AST: 22 U/L (ref 15–41)
Albumin: 4 g/dL (ref 3.5–5.0)
Alkaline Phosphatase: 61 U/L (ref 38–126)
Anion gap: 8 (ref 5–15)
BUN: 21 mg/dL (ref 8–23)
CO2: 27 mmol/L (ref 22–32)
Calcium: 9.7 mg/dL (ref 8.9–10.3)
Chloride: 106 mmol/L (ref 98–111)
Creatinine, Ser: 0.88 mg/dL (ref 0.44–1.00)
GFR calc Af Amer: >60 mL/min (ref >60)
GFR calc non Af Amer: >60 mL/min (ref >60)
Glucose, Bld: 79 mg/dL (ref 70–99)
Potassium: 4.5 mmol/L (ref 3.5–5.1)
Sodium: 141 mmol/L (ref 135–145)
Total Bilirubin: 0.8 mg/dL (ref 0.3–1.2)
Total Protein: 7.2 g/dL (ref 6.5–8.1)

## 2020-03-13 LAB — CBC WITH DIFFERENTIAL/PLATELET
Abs Immature Granulocytes: 0.01 10*3/uL (ref 0.00–0.07)
Basophils Absolute: 0.1 10*3/uL (ref 0.0–0.1)
Basophils Relative: 1 %
Eosinophils Absolute: 0.2 10*3/uL (ref 0.0–0.5)
Eosinophils Relative: 3 %
HCT: 44.5 % (ref 36.0–46.0)
Hemoglobin: 14.2 g/dL (ref 12.0–15.0)
Immature Granulocytes: 0 %
Lymphocytes Relative: 31 %
Lymphs Abs: 1.8 10*3/uL (ref 0.7–4.0)
MCH: 30.3 pg (ref 26.0–34.0)
MCHC: 31.9 g/dL (ref 30.0–36.0)
MCV: 94.9 fL (ref 80.0–100.0)
Monocytes Absolute: 0.6 10*3/uL (ref 0.1–1.0)
Monocytes Relative: 9 %
Neutro Abs: 3.3 10*3/uL (ref 1.7–7.7)
Neutrophils Relative %: 56 %
Platelets: 272 10*3/uL (ref 150–400)
RBC: 4.69 MIL/uL (ref 3.87–5.11)
RDW: 14.1 % (ref 11.5–15.5)
WBC: 5.9 10*3/uL (ref 4.0–10.5)
nRBC: 0 % (ref 0.0–0.2)

## 2020-03-13 LAB — TSH: TSH: 2.31 u[IU]/mL (ref 0.350–4.500)

## 2020-03-13 LAB — MAGNESIUM: Magnesium: 2.4 mg/dL (ref 1.7–2.4)

## 2020-03-13 NOTE — Telephone Encounter (Signed)
She can go down to an 81mg  aspiring instead of a full 325 mg aspirin and see how she does

## 2020-03-13 NOTE — Telephone Encounter (Addendum)
Patient is scheduled with Dr. Dishman Revel for a DOD appt today @ 3:20pm. lmom that the patient is to keep the appt. If any reoccurrence of symptoms prior to the appt, she should report to the ED for evaluation.

## 2020-03-13 NOTE — Telephone Encounter (Signed)
Pt c/o of Chest Pain: STAT if CP now or developed within 24 hours  1. Are you having CP right now? No, yesterday morning in the center of her chest, she had pain  2. Are you experiencing any other symptoms (ex. SOB, nausea, vomiting, sweating)? Lightheadedness, sweating, dizziness, vision "darkened" about 2 weeks ago. Also has had several episodes like this.  Pain sometimes is in the back of her chest  3. How long have you been experiencing CP? Couple months  4. Is your CP continuous or coming and going? Comes and goes  5. Have you taken Nitroglycerin? ?no   Patient has an appointment on 5/19 with Laurann Montana, NP

## 2020-03-13 NOTE — Telephone Encounter (Signed)
Patient has been made aware with medical advice and verbalized understanding 

## 2020-03-13 NOTE — Progress Notes (Signed)
Follow-up Outpatient Visit Date: 03/13/2020  Primary Care Provider: Renee Rival, NP [place] [id number]   Primary Cardiologist: Esmond Plants, MD, PhD  Chief Complaint: Dizziness, diaphoresis, and chest pain  HPI:  Gina Mcbride is a 74 y.o. female with history of mitral valve prolapse, thrombocytosis, asthma, and possible amaurosis fugax with mild left carotid artery stenosis, who presents for urgent evaluation of multiple symptoms.  For several years, Gina Mcbride has experienced episodic diaphoresis, generalized weakness, darkening of her vision, and feeling "swimmy headed."  In the past, this has been attributed to low blood sugars, though she does not have a history of diabetes.  She usually sits down and eventually the symptoms abate on their own.  Episodes typically happen from 2-3 times a month to once a week.  About 4 weeks ago, she had a more prolonged episode while at church, after which it took about 10 minutes for her vision to return back to normal.  Gina Mcbride has also experienced episodic chest pain for quite some time.  She usually experiences a sharp sensation moving through her chest that only last a second or two.  However, yesterday morning she woke up with a dull pain in her lower chest/upper abdomen.  It lasted for at least an hour before resolving on its own.  She has not had any exertional chest pain.  She has chronic exertional dyspnea but feels like it might be a little worse than in years past.  She has occasional transient palpitations though it is unclear if they correspond to her chest pain or other aforementioned symptoms.  She notes transient leg swelling when it is warm outside but otherwise no significant edema.  Other than recently having been placed on daily etodolac for joint pains about a month ago), Gina Mcbride denies any other medication changes or clear precipitants for her escalation of  symptoms.  --------------------------------------------------------------------------------------------------  Past Medical History:  Diagnosis Date  . Asthma   . Barlow's syndrome   . COPD (chronic obstructive pulmonary disease) (HCC)    hx - no issues now  . Dysrhythmia    rare - unnamed - followed by PCP, palpatations  . Family history of adverse reaction to anesthesia    Father - severe swelling after open heart surgery  . Osteoporosis   . Palpitations   . Raynaud's disease without gangrene   . Right tennis elbow   . Thrombocytosis ([place])   . Vertigo    yrs ago - no issues now  . Vitamin D deficiency   . Wears dentures    full upper, partial lower   Past Surgical History:  Procedure Laterality Date  . APPENDECTOMY    . BLEPHAROPLASTY    . CATARACT EXTRACTION W/PHACO Right 07/01/2015   Procedure: CATARACT EXTRACTION PHACO AND INTRAOCULAR LENS PLACEMENT ([place]);  Surgeon: Leandrew Koyanagi, MD;  Location: [facility];  Service: Ophthalmology;  Laterality: Right;  . CATARACT EXTRACTION W/PHACO Left 11/15/2017   Procedure: CATARACT EXTRACTION PHACO AND INTRAOCULAR LENS PLACEMENT ([place]) LEFT;  Surgeon: Leandrew Koyanagi, MD;  Location: [place];  Service: Ophthalmology;  Laterality: Left;  . COLONOSCOPY    . COLONOSCOPY WITH PROPOFOL N/A 06/27/2018   Procedure: COLONOSCOPY WITH PROPOFOL;  Surgeon: Toledo, Benay Pike, MD;  Location: [facility];  Service: Gastroenterology;  Laterality: N/A;  . ESOPHAGOGASTRODUODENOSCOPY (EGD) WITH PROPOFOL N/A 06/27/2018   Procedure: ESOPHAGOGASTRODUODENOSCOPY (EGD) WITH PROPOFOL;  Surgeon: Toledo, Benay Pike, MD;  Location: [facility];  Service: Gastroenterology;  Laterality: N/A;  . TONSILLECTOMY    . TUBAL LIGATION      Current Meds  Medication Sig  . aspirin EC 81 MG tablet Take 81 mg by mouth daily.  . Calcium Citrate-Vitamin D (CALCIUM + D PO) Take by mouth. lunchtime  . Cholecalciferol (VITAMIN D PO) Take by  mouth. lunchtime  . clonazePAM (KLONOPIN) 1 MG tablet Take 1 mg by mouth at bedtime.  Marland Kitchen denosumab (PROLIA) 60 MG/ML SOLN injection Inject 60 mg into the skin every 6 (six) months. Administer in upper arm, thigh, or abdomen  . etodolac (LODINE) 400 MG tablet Take 400 mg by mouth 2 (two) times daily.  . nitroGLYCERIN (NITRO-BID) 2 % ointment Nitro-Bid 2 % transdermal ointment  . [DISCONTINUED] aspirin 325 MG EC tablet Take 325 mg by mouth daily.    Allergies: Erythromycin base, Other, Oxytetracycline, and Shrimp [shellfish allergy]  Social History   Tobacco Use  . Smoking status: Former Smoker    Packs/day: 1.00    Years: 25.00    Pack years: 25.00    Types: Cigarettes    Quit date: 10/31/1994    Years since quitting: 25.3  . Smokeless tobacco: Never Used  Substance Use Topics  . Alcohol use: No  . Drug use: Never    Family History  Problem Relation Age of Onset  . Breast cancer Sister 15    Review of Systems: A 12-system review of systems was performed and was negative except as noted in the HPI.  --------------------------------------------------------------------------------------------------  Physical Exam: BP 138/80 (BP Location: Left Arm, Patient Position: Sitting, Cuff Size: Normal)   Pulse 93   Ht 5\' 4"  (1.626 m)   Wt 108 lb 8 oz (49.2 kg)   BMI 18.62 kg/m   General: NAD. Neck: No JVD or HJR.  No carotid bruit. Lungs: Clear to auscultation bilaterally without wheezes or crackles. Heart: Irregular rhythm with 1/6 systolic murmur.  No rubs or gallops. Abdomen: Soft, nontender, nondistended. Extremities: No lower extremity edema.  Prominent varicose veins noted in both distal calves and ankles.  EKG: Sinus rhythm with frequent PVCs and possible left atrial enlargement.  Otherwise, no significant abnormality  Lab Results  Component Value Date   WBC 5.9 03/13/2020   HGB 14.2 03/13/2020   HCT 44.5 03/13/2020   MCV 94.9 03/13/2020   PLT 272 03/13/2020    Lab  Results  Component Value Date   NA 141 03/13/2020   K 4.5 03/13/2020   CL 106 03/13/2020   CO2 27 03/13/2020   BUN 21 03/13/2020   CREATININE 0.88 03/13/2020   GLUCOSE 79 03/13/2020   ALT 16 03/13/2020    No results found for: CHOL, HDL, LDLCALC, LDLDIRECT, TRIG, CHOLHDL  --------------------------------------------------------------------------------------------------  ASSESSMENT AND PLAN: Atypical chest pain, palpitations, lightheadedness, dyspnea on exertion, and mitral valve prolapse: Gina Mcbride has multiple complaints, some of which have been longstanding, some of which are new or worsening in severity/intensity.  She describes 2 different qualities of chest pain, fleeting sharp pain to have been present for quite some time and more recent single episode of dull lower chest/epigastric pain.  I wonder if addition of etodolac may have caused some gastric or esophageal irritation leading to her pain.  Examination today is unrevealing.  EKG is notable, however, for frequent PACs.  Given her episodic dizziness and darkening of her vision, tacky arrhythmia is a consideration.  She is at elevated risk for arrhythmias with her history of mitral valve prolapse I have recommended that we  obtain a CBC, CMP, magnesium level, and TSH today as well as arrange for a 14-day event monitor, transthoracic echocardiogram, and pharmacologic myocardial perfusion stress test.  I have recommended that Gina Mcbride begin taking famotidine 20 mg twice daily and also touch base with her PCP regarding long-term NSAID use.  Follow-up: Return to clinic to see Dr. Rockey Situ or an APP in approximately 6 weeks.  Nelva Bush, MD 03/14/2020 5:23 PM

## 2020-03-13 NOTE — Patient Instructions (Signed)
Medication Instructions:  Your physician recommends that you continue on your current medications as directed. Please refer to the Current Medication list given to you today.  *If you need a refill on your cardiac medications before your next appointment, please call your pharmacy*   Lab Work: Please have your labs drawn at the medical mall today  [place], [place], [facility], mag today.  If you have labs (blood work) drawn today and your tests are completely normal, you will receive your results only by: Marland Kitchen MyChart Message (if you have MyChart) OR . A paper copy in the mail If you have any lab test that is abnormal or we need to change your treatment, we will call you to review the results.   Testing/Procedures: Your physician has requested that you have an echocardiogram. Echocardiography is a painless test that uses sound waves to create images of your heart. It provides your doctor with information about the size and shape of your heart and how well your heart's chambers and valves are working. This procedure takes approximately one hour. There are no restrictions for this procedure.  Your physician has requested that you have a lexiscan myoview. For further information please visit [email or web address]. Please follow instruction sheet, as given.  Your physician has recommended that you wear a Zio monitor. (To be worn for 14 days). This monitor is a medical device that records the heart's electrical activity. Doctors most often use these monitors to diagnose arrhythmias. Arrhythmias are problems with the speed or rhythm of the heartbeat. The monitor is a small device applied to your chest. You can wear one while you do your normal daily activities. While wearing this monitor if you have any symptoms to push the button and record what you felt. Once you have worn this monitor for the period of time provider prescribed (Usually 14 days), you will return the monitor device in the postage paid box. Once it is  returned they will download the data collected and provide [place] with a report which the provider will then review and we will call you with those results. Important tips:  1. Avoid showering during the first 24 hours of wearing the monitor. 2. Avoid excessive sweating to help maximize wear time. 3. Do not submerge the device, no hot tubs, and no swimming pools. 4. Keep any lotions or oils away from the patch. 5. After 24 hours you may shower with the patch on. Take brief showers with your back facing the shower head.  6. Do not remove patch once it has been placed because that will interrupt data and decrease adhesive wear time. 7. Push the button when you have any symptoms and write down what you were feeling. 8. Once you have completed wearing your monitor, remove and place into box which has postage paid and place in your outgoing mailbox.  9. If for some reason you have misplaced your box then call our office and we can provide another box and/or mail it off for you.         Follow-Up: At [facility], you and your health needs are our priority.  As part of our continuing mission to provide you with exceptional heart care, we have created designated Provider Care Teams.  These Care Teams include your primary Cardiologist (physician) and Advanced Practice Providers (APPs -  Physician Assistants and Nurse Practitioners) who all work together to provide you with the care you need, when you need it.  We recommend signing up for the patient portal  called "MyChart".  Sign up information is provided on this After Visit Summary.  MyChart is used to connect with patients for Virtual Visits (Telemedicine).  Patients are able to view lab/test results, encounter notes, upcoming appointments, etc.  Non-urgent messages can be sent to your provider as well.   To learn more about what you can do with MyChart, go to [email or web address].    Your next appointment:   6 week(s)  The format for your  next appointment:   In Person  Provider:    You may see  Dr. Rockey Situ  or one of the following Advanced Practice Providers on your designated Care Team:    Murray Hodgkins, NP  Christell Faith, PA-C  Marrianne Mood, PA-C    Other Instructions [place]  Your caregiver has ordered a Stress Test with nuclear imaging. The purpose of this test is to evaluate the blood supply to your heart muscle. This procedure is referred to as a "Non-Invasive Stress Test." This is because other than having an IV started in your vein, nothing is inserted or "invades" your body. Cardiac stress tests are done to find areas of poor blood flow to the heart by determining the extent of coronary artery disease (CAD). Some patients exercise on a treadmill, which naturally increases the blood flow to your heart, while others who are  unable to walk on a treadmill due to physical limitations have a pharmacologic/chemical stress agent called Lexiscan . This medicine will mimic walking on a treadmill by temporarily increasing your coronary blood flow.   Please note: these test may take anywhere between 2-4 hours to complete  PLEASE REPORT TO [place] AT THE FIRST DESK WILL DIRECT YOU WHERE TO GO  Date of Procedure:_____________________________________  Arrival Time for Procedure:______________________________     PLEASE NOTIFY THE OFFICE AT LEAST 24 HOURS IN ADVANCE IF YOU ARE UNABLE TO KEEP YOUR APPOINTMENT.  [phone] AND  PLEASE NOTIFY NUCLEAR MEDICINE AT [facility] AT LEAST 24 HOURS IN ADVANCE IF YOU ARE UNABLE TO KEEP YOUR APPOINTMENT. [phone]  How to prepare for your Myoview test:  1. Do not eat or drink after midnight 2. No caffeine for 24 hours prior to test 3. No smoking 24 hours prior to test. 4. Your medication may be taken with water.  If your doctor stopped a medication because of this test, do not take that medication. 5. Ladies, please do not wear dresses.   Skirts or pants are appropriate. Please wear a short sleeve shirt. 6. No perfume, cologne or lotion. 7. Wear comfortable walking shoes. No heels!

## 2020-03-14 ENCOUNTER — Encounter: Payer: Self-pay | Admitting: Internal Medicine

## 2020-03-18 ENCOUNTER — Ambulatory Visit: Payer: Medicare Other | Admitting: Family

## 2020-03-20 ENCOUNTER — Other Ambulatory Visit: Payer: Medicare Other

## 2020-03-31 ENCOUNTER — Other Ambulatory Visit: Payer: Self-pay

## 2020-03-31 ENCOUNTER — Encounter
Admission: RE | Admit: 2020-03-31 | Discharge: 2020-03-31 | Disposition: A | Discharge status: Home / Self Care | Payer: Medicare Other | Source: Ambulatory Visit | Attending: Internal Medicine | Admitting: Internal Medicine

## 2020-03-31 DIAGNOSIS — R0789 Other chest pain: Secondary | ICD-10-CM | POA: Insufficient documentation

## 2020-03-31 MED ORDER — TECHNETIUM TC 99M TETROFOSMIN IV KIT
30.0000 | PACK | Freq: Once | INTRAVENOUS | Status: AC | PRN
  Administered 2020-03-31: 29.586 via INTRAVENOUS

## 2020-03-31 MED ORDER — REGADENOSON 0.4 MG/5ML IV SOLN
0.4000 mg | Freq: Once | INTRAVENOUS | Status: AC
  Administered 2020-03-31: 0.4 mg via INTRAVENOUS

## 2020-03-31 MED ORDER — TECHNETIUM TC 99M TETROFOSMIN IV KIT
10.0000 | PACK | Freq: Once | INTRAVENOUS | Status: AC | PRN
  Administered 2020-03-31: 9.86 via INTRAVENOUS

## 2020-04-01 ENCOUNTER — Telehealth: Payer: Self-pay

## 2020-04-01 LAB — NM MYOCAR MULTI W/SPECT W/WALL MOTION / EF
LV dias vol: 37 mL (ref 46–106)
LV sys vol: 15 mL
Peak HR: 105 {beats}/min
Percent HR: 71 %
Rest HR: 81 {beats}/min
TID: 0.93

## 2020-04-01 NOTE — Telephone Encounter (Signed)
Attempted to call patient. LMTCB 04/01/2020

## 2020-04-01 NOTE — Telephone Encounter (Signed)
-----   Message from Gina Bush, MD sent at 04/01/2020  6:47 AM EDT ----- Please let [name] know that her stress test is low risk but shows a subtle abnormality suggestive of a small blockage.  I suggest that she continue her current medications and to let us know if she has any recurrent chest pain.  We will follow-up after completion of echocardiogram later this month.

## 2020-04-02 NOTE — Telephone Encounter (Signed)
No answer. Left detailed message with results, ok per DPR, and to call back if any questions.  

## 2020-04-03 ENCOUNTER — Other Ambulatory Visit: Payer: Self-pay | Admitting: Internal Medicine

## 2020-04-03 DIAGNOSIS — R0789 Other chest pain: Secondary | ICD-10-CM

## 2020-04-03 DIAGNOSIS — R42 Dizziness and giddiness: Secondary | ICD-10-CM

## 2020-04-23 ENCOUNTER — Other Ambulatory Visit: Payer: Self-pay

## 2020-04-23 ENCOUNTER — Ambulatory Visit (INDEPENDENT_AMBULATORY_CARE_PROVIDER_SITE_OTHER): Payer: Medicare Other

## 2020-04-23 DIAGNOSIS — R42 Dizziness and giddiness: Secondary | ICD-10-CM

## 2020-04-23 DIAGNOSIS — R0789 Other chest pain: Secondary | ICD-10-CM | POA: Diagnosis not present

## 2020-04-29 ENCOUNTER — Other Ambulatory Visit: Payer: Self-pay

## 2020-04-29 ENCOUNTER — Encounter: Payer: Self-pay | Admitting: Family

## 2020-04-29 ENCOUNTER — Ambulatory Visit (INDEPENDENT_AMBULATORY_CARE_PROVIDER_SITE_OTHER): Payer: Medicare Other | Admitting: Family

## 2020-04-29 VITALS — BP 118/72 | HR 84 | Ht 63.5 in | Wt 108.2 lb

## 2020-04-29 DIAGNOSIS — I341 Nonrheumatic mitral (valve) prolapse: Secondary | ICD-10-CM

## 2020-04-29 DIAGNOSIS — I493 Ventricular premature depolarization: Secondary | ICD-10-CM

## 2020-04-29 DIAGNOSIS — R002 Palpitations: Secondary | ICD-10-CM

## 2020-04-29 DIAGNOSIS — I25118 Atherosclerotic heart disease of native coronary artery with other forms of angina pectoris: Secondary | ICD-10-CM

## 2020-04-29 DIAGNOSIS — R0609 Other forms of dyspnea: Secondary | ICD-10-CM

## 2020-04-29 DIAGNOSIS — R0600 Dyspnea, unspecified: Secondary | ICD-10-CM | POA: Diagnosis not present

## 2020-04-29 MED ORDER — METOPROLOL SUCCINATE ER 50 MG PO TB24
50.0000 mg | ORAL_TABLET | Freq: Every day | ORAL | 6 refills | Status: DC
Start: 2020-04-29 — End: 2020-05-27

## 2020-04-29 NOTE — Progress Notes (Signed)
Office Visit    Patient Name: Gina Mcbride Date of Encounter: 04/30/2020  Primary Care Provider:  Renee Rival, NP Primary Cardiologist:  Ida Rogue, MD Electrophysiologist:  None   Chief Complaint    Gina Mcbride is a 74 y.o. female with a hx of mitral valve prolapse, thrombocytosis, asthma, possible amaurosis fugax with mild left carotid artery stenosis, coronary artery disease, valvular heart disease, palpitations presents today for follow-up after cardiac testing  Past Medical History    Past Medical History:  Diagnosis Date  . Asthma   . Barlow's syndrome   . COPD (chronic obstructive pulmonary disease) (HCC)    hx - no issues now  . Dysrhythmia    rare - unnamed - followed by PCP, palpatations  . Family history of adverse reaction to anesthesia    Father - severe swelling after open heart surgery  . Osteoporosis   . Palpitations   . Raynaud's disease without gangrene   . Right tennis elbow   . Thrombocytosis ([place])   . Vertigo    yrs ago - no issues now  . Vitamin D deficiency   . Wears dentures    full upper, partial lower   Past Surgical History:  Procedure Laterality Date  . APPENDECTOMY    . BLEPHAROPLASTY    . CATARACT EXTRACTION W/PHACO Right 07/01/2015   Procedure: CATARACT EXTRACTION PHACO AND INTRAOCULAR LENS PLACEMENT ([place]);  Surgeon: Leandrew Koyanagi, MD;  Location: [place];  Service: Ophthalmology;  Laterality: Right;  . CATARACT EXTRACTION W/PHACO Left 11/15/2017   Procedure: CATARACT EXTRACTION PHACO AND INTRAOCULAR LENS PLACEMENT ([place]) LEFT;  Surgeon: Leandrew Koyanagi, MD;  Location: [place];  Service: Ophthalmology;  Laterality: Left;  . COLONOSCOPY    . COLONOSCOPY WITH PROPOFOL N/A 06/27/2018   Procedure: COLONOSCOPY WITH PROPOFOL;  Surgeon: Toledo, Benay Pike, MD;  Location: [facility];  Service: Gastroenterology;  Laterality: N/A;  . ESOPHAGOGASTRODUODENOSCOPY (EGD) WITH PROPOFOL N/A  06/27/2018   Procedure: ESOPHAGOGASTRODUODENOSCOPY (EGD) WITH PROPOFOL;  Surgeon: Toledo, Benay Pike, MD;  Location: [facility];  Service: Gastroenterology;  Laterality: N/A;  . TONSILLECTOMY    . TUBAL LIGATION      Allergies  Allergies  Allergen Reactions  . Erythromycin Base   . Other Swelling    Hand contact with raw shrimp  . Oxytetracycline Other (See Comments)    Mouth blisters (all -mycins) Mouth blisters (all -mycins)  . Shrimp [Shellfish Allergy] Swelling    Hand contact with raw shrimp    History of Present Illness    JALEIGHA Mcbride is a 74 y.o. female with a hx of mitral valve prolapse, thrombocytosis, asthma, possible amaurosis fugax with mild left carotid artery stenosis, coronary artery disease, valvular heart disease, palpitations last seen by Dr. Moskowitz Revel 03/13/2020.  Clinic visit 03/13/2020 she noted a several year history of episodic diaphoresis, generalized weakness, darkening of vision.  Has been previously attributed to low blood sugars though no history of diabetes.  She also noted episodic chest pain.  Her EKG showed frequent PACs.  She was noted to have been placed on etodolac for joint pain 1 month prior and was recommended to stop aspirin as there was concern it may be contributing to abdominal discomfort.  She was recommended for stress test, ZIO, echocardiogram.  Her stress test was low risk though did show small in size, mild in severity, reversible defect involving basal and mid inferolateral segments most consistent with ischemia.  No significant scar noted.  ZIO monitor 1 for 14 days showed predominantly NSR with occasional PACs and frequent PVCs (PVC burden 66-month) see parentheses.  She had 106 atrial runs lasting up to 13.8 seconds with maximal rate of 207 bpm.  Her triggered events were associated with PACs, PVC, brief atrial runs.  Echo 04/23/2020 LVEF 55 to 60%, and no regional wall motion abnormalities, normal diastolic parameters, mildly elevated PASP  with RV systolic pressure 38 mmHg, LA mildly dilated, mild late systolic prolapse of both leaflets of mitral valve with moderate thickening of mitral valve leaflets and moderate MR without stenosis, mild to moderate TR.  Long discussion reviewing all of her cardiac testing.  She is reassured to have explanation of some of her symptoms.  She reports continued palpitations particularly in the evenings that will wake her from sleep and are associated with anxiety.  Endorses fatigue and weakness.  We discussed that frequent PVCs could be a likely etiology.  She reports chest pain that is primarily associated with her palpitations but no exercise intolerance.  EKGs/Labs/Other Studies Reviewed:   The following studies were reviewed today:  Echo 04/23/20  1. Left ventricular ejection fraction, by estimation, is 55 to 60%. The  left ventricle has normal function. The left ventricle has no regional  wall motion abnormalities. Left ventricular diastolic parameters were  normal.   2. Right ventricular systolic function is normal. The right ventricular  size is normal. There is mildly elevated pulmonary artery systolic  pressure. The estimated right ventricular systolic pressure is 35.5 mmHg.   3. Left atrial size was mildly dilated.   4. The mitral valve is abnormal. There is mild late systolic prolapse of  both leaflets of the mitral valve. There is moderate thickening of the  mitral valve leaflet(s). Moderate mitral valve regurgitation. No evidence  of mitral stenosis.   5. Tricuspid valve regurgitation is mild to moderate.   6. The aortic valve is normal in structure. Aortic valve regurgitation is  not visualized. Mild aortic valve sclerosis is present, with no evidence  of aortic valve stenosis.   7. Pulmonic valve regurgitation is moderate.   8. The inferior vena cava is normal in size with <50% respiratory  variability, suggesting right atrial pressure of 8 mmHg.   Monitor 04/16/20  The  patient was monitored for 13 days, 15 hours.  The predominant rhythm was sinus with an average rate of 90 bpm (range 68 to 132 bpm in sinus).  There were occasional PACs and frequent PVCs (PVC burden 26%).  106 atrial runs occurred, lasting up to 13.8 seconds with a maximum rate of 207 bpm.  No sustained arrhythmia or prolonged pause was observed.  Patient triggered events correspond to sinus rhythm with PACs, PVCs, and brief atrial runs.   Predominantly sinus rhythm with occasional PACs and frequent PVCs.  Multiple episodes of PSVT occurred, lasting up to 14 seconds.  Lexiscan Myoview 03/31/20  Abnormal pharmacologic myocardial perfusion stress test.  There is a small in size, mild in severity, reversible defect involving the basal and mid inferolateral segments most consistent with ischemia.  There is no significant scar.  The left ventricular ejection fraction is normal (>55%).  Attenuation correction CT shows coronary artery calcification and aortic atherosclarosis.  This is a low risk study.   EKG:  EKG is  ordered today.  The ekg ordered today demonstrates SR 84 bpm with occasional PAC and LAE.   Recent Labs: 03/13/2020: ALT 16; BUN 21; Creatinine, Ser 0.88; Hemoglobin 14.2; Magnesium 2.4;  Platelets 272; Potassium 4.5; Sodium 141; TSH 2.310  Recent Lipid Panel No results found for: CHOL, TRIG, HDL, CHOLHDL, VLDL, LDLCALC, LDLDIRECT  Home Medications   Current Meds  Medication Sig  . Calcium Citrate-Vitamin D (CALCIUM + D PO) Take by mouth. lunchtime  . Cholecalciferol (VITAMIN D PO) Take by mouth. lunchtime  . clonazePAM (KLONOPIN) 1 MG tablet Take 1 mg by mouth at bedtime.  Marland Kitchen denosumab (PROLIA) 60 MG/ML SOLN injection Inject 60 mg into the skin every 6 (six) months. Administer in upper arm, thigh, or abdomen  . etodolac (LODINE) 400 MG tablet Take 400 mg by mouth daily.   . nitroGLYCERIN (NITRO-BID) 2 % ointment Nitro-Bid 2 % transdermal ointment    Review of Systems        Review of Systems  Constitutional: Positive for malaise/fatigue. Negative for chills and fever.  Cardiovascular: Positive for chest pain, dyspnea on exertion and palpitations. Negative for leg swelling, near-syncope, orthopnea and syncope.  Respiratory: Negative for cough, shortness of breath and wheezing.   Gastrointestinal: Negative for nausea and vomiting.  Neurological: Negative for dizziness, light-headedness and weakness.   All other systems reviewed and are otherwise negative except as noted above.  Physical Exam    VS:  BP 118/72 (BP Location: Left Arm, Patient Position: Sitting, Cuff Size: Normal)   Pulse 84   Ht 5' 3.5" (1.613 m)   Wt 108 lb 4 oz (49.1 kg)   SpO2 98%   BMI 18.87 kg/m  , BMI Body mass index is 18.87 kg/m. GEN: Well nourished, well developed, in no acute distress. HEENT: normal. Neck: Supple, no JVD, carotid bruits, or masses. Cardiac: RRR, no murmurs, rubs, or gallops. No clubbing, cyanosis, edema.  Radials/DP/PT 2+ and equal bilaterally.  Respiratory:  Respirations regular and unlabored, clear to auscultation bilaterally. GI: Soft, nontender, nondistended, BS + x 4. MS: No deformity or atrophy. Skin: Warm and dry, no rash. Neuro:  Strength and sensation are intact. Psych: Normal affect.  Assessment & Plan    1. Palpitations/freqent PVC/Atrial runs/Infrequent PAC - Noted by recent ZIO with PVC burden 26%. Encouraged to reduce caffeine, avoid proarrthymic agents. Start Toprol XL 25mg  daily. Prompt follow up in 3 weeks. If no marked improvement, consider referral to EP.   2. Coronary artery disease - Lexiscan myoview low risk though did show small area with reversible defect consistent with ischemia. She is having chest pain primarily associated with her palpitations. Will plan for reassessment of chest pain after addition of beta blocker, as above. She is on BB. Will defer aspirin due to etodolac use. Reports her cholesterol is "good" when check by  PCP, but no available labs. Will request for review, may require statin.   3. MV prolapse, moderate MR - Noted by recent echo. Continue optimal BP control. No signs of volume overload, no indication for diuretic. Plan for treatment of palpitations, as above.   Disposition: Follow up in 3 week(s) with Dr. Wallie Renshaw, NP 04/30/2020, 9:08 AM

## 2020-04-29 NOTE — Patient Instructions (Addendum)
Medication Instructions:  Your physician has recommended you make the following change in your medication:   START Metoprolol Succinate 25mg  daily   *If you need a refill on your cardiac medications before your next appointment, please call your pharmacy*  Lab Work: No lab work today.   Testing/Procedures: Your EGK today showed normal sinus rhythm an occasional early beat in the bottom chamber of your heart called PVC's or "premature ventricular contractions".   Your ultrasound of your heart showed normal pumping function, moderate leakiness of the mitral valve.  Your stress test showed a small but mild spot consistent with a blockage. As your echocardiogram showed that your heart walls moved normally we will plan to treat this conservatively.   Your ZIO monitor showed frequent early beats in the top and bottom chambers of your heart. These are very common, but as yours occur 26% of the time. These are often associated with shortness of breath, the heart "skipping", and fatigue.    Follow-Up: At [facility], you and your health needs are our priority.  As part of our continuing mission to provide you with exceptional heart care, we have created designated Provider Care Teams.  These Care Teams include your primary Cardiologist (physician) and Advanced Practice Providers (APPs -  Physician Assistants and Nurse Practitioners) who all work together to provide you with the care you need, when you need it.  We recommend signing up for the patient portal called "MyChart".  Sign up information is provided on this After Visit Summary.  MyChart is used to connect with patients for Virtual Visits (Telemedicine).  Patients are able to view lab/test results, encounter notes, upcoming appointments, etc.  Non-urgent messages can be sent to your provider as well.   To learn more about what you can do with MyChart, go to [email or web address].    Your next appointment:   3-4 weeks   The format for  your next appointment:   In Person  Provider:   You may see Ida Rogue, MD or one of the following Advanced Practice Providers on your designated Care Team:    Murray Hodgkins, NP  Christell Faith, PA-C  Marrianne Mood, PA-C  Laurann Montana, NP  Other Instructions   Premature Ventricular Contraction  A premature ventricular contraction (PVC) is a common kind of irregular heartbeat (arrhythmia). These contractions are extra heartbeats that start in the ventricles of the heart and occur too early in the normal sequence. During the PVC, the heart's normal electrical pathway is not used, so the beat is shorter and less effective. In most cases, these contractions come and go and do not require treatment. What are the causes? Common causes of the condition include:  Smoking.  Drinking alcohol.  Certain medicines.  Some illegal drugs.  Stress.  Caffeine. Certain medical conditions can also cause PVCs:  Heart failure.  Heart attack, or coronary artery disease.  Heart valve problems.  Changes in minerals in the blood (electrolytes).  Low blood oxygen levels or high carbon dioxide levels. In many cases, the cause of this condition is not known. What are the signs or symptoms? The main symptom of this condition is fast or skipped heartbeats (palpitations). Other symptoms include:  Chest pain.  Shortness of breath.  Feeling tired.  Dizziness.  Difficulty exercising. In some cases, there are no symptoms. How is this diagnosed? This condition may be diagnosed based on:  Your medical history.  A physical exam. During the exam, the health care provider will check for irregular  heartbeats.  Tests, such as: ? An ECG (electrocardiogram) to monitor the electrical activity of your heart. ? An ambulatory cardiac monitor. This device records your heartbeats for 24 hours or more. ? Stress tests to see how exercise affects your heart rhythm and blood supply. ? An  echocardiogram. This test uses sound waves (ultrasound) to produce an image of your heart. ? An electrophysiology study (EPS). This test checks for electrical problems in your heart. How is this treated? Treatment for this condition depends on any underlying conditions, the type of PVCs that you are having, and how much the symptoms are interfering with your daily life. Possible treatments include:  Avoiding things that cause premature contractions (triggers). These include caffeine and alcohol.  Taking medicines if symptoms are severe or if the extra heartbeats are frequent.  Getting treatment for underlying conditions that cause PVCs.  Having an implantable cardioverter defibrillator (ICD), if you are at risk for a serious arrhythmia. The ICD is a small device that is inserted into your chest to monitor your heartbeat. When it senses an irregular heartbeat, it sends a shock to bring the heartbeat back to normal.  Having a procedure to destroy the portion of the heart tissue that sends out abnormal signals (catheter ablation). In some cases, no treatment is required. Follow these instructions at home: Lifestyle  Do not use any products that contain nicotine or tobacco, such as cigarettes, e-cigarettes, and chewing tobacco. If you need help quitting, ask your health care provider.  Do not use illegal drugs.  Exercise regularly. Ask your health care provider what type of exercise is safe for you.  Try to get at least 7-9 hours of sleep each night, or as much as recommended by your health care provider.  Find healthy ways to manage stress. Avoid stressful situations when possible. Alcohol use  Do not drink alcohol if: ? Your health care provider tells you not to drink. ? You are pregnant, may be pregnant, or are planning to become pregnant. ? Alcohol triggers your episodes.  If you drink alcohol: ? Limit how much you use to:  0-1 drink a day for women.  0-2 drinks a day for  men.  Be aware of how much alcohol is in your drink. In the [place] one drink equals one 12 oz bottle of beer (355 mL), one 5 oz glass of wine (148 mL), or one 1 oz glass of hard liquor (44 mL). General instructions  Take over-the-counter and prescription medicines only as told by your health care provider.  If caffeine triggers episodes of PVC, do not eat, drink, or use anything with caffeine in it.  Keep all follow-up visits as told by your health care provider. This is important. Contact a health care provider if you:  Feel palpitations. Get help right away if you:  Have chest pain.  Have shortness of breath.  Have sweating for no reason.  Have nausea and vomiting.  Become light-headed or you faint. Summary  A premature ventricular contraction (PVC) is a common kind of irregular heartbeat (arrhythmia).  In most cases, these contractions come and go and do not require treatment.  You may need to wear an ambulatory cardiac monitor. This records your heartbeats for 24 hours or more.  Treatment depends on any underlying conditions, the type of PVCs that you are having, and how much the symptoms are interfering with your daily life. This information is not intended to replace advice given to you by your health care provider.  Make sure you discuss any questions you have with your health care provider. Document Revised: 07/12/2018 Document Reviewed: 07/12/2018 Elsevier Patient Education  2020 [employer].

## 2020-05-27 ENCOUNTER — Ambulatory Visit (INDEPENDENT_AMBULATORY_CARE_PROVIDER_SITE_OTHER): Payer: Medicare Other | Admitting: Family

## 2020-05-27 ENCOUNTER — Other Ambulatory Visit: Payer: Self-pay

## 2020-05-27 ENCOUNTER — Encounter: Payer: Self-pay | Admitting: Family

## 2020-05-27 VITALS — BP 112/60 | HR 63 | Ht 63.5 in | Wt 108.0 lb

## 2020-05-27 DIAGNOSIS — I6523 Occlusion and stenosis of bilateral carotid arteries: Secondary | ICD-10-CM

## 2020-05-27 DIAGNOSIS — I700 Atherosclerosis of aorta: Secondary | ICD-10-CM | POA: Diagnosis not present

## 2020-05-27 DIAGNOSIS — I341 Nonrheumatic mitral (valve) prolapse: Secondary | ICD-10-CM | POA: Diagnosis not present

## 2020-05-27 DIAGNOSIS — R002 Palpitations: Secondary | ICD-10-CM | POA: Diagnosis not present

## 2020-05-27 DIAGNOSIS — I2510 Atherosclerotic heart disease of native coronary artery without angina pectoris: Secondary | ICD-10-CM | POA: Diagnosis not present

## 2020-05-27 MED ORDER — METOPROLOL SUCCINATE ER 50 MG PO TB24: 50.0000 mg | ORAL_TABLET | Freq: Every day | ORAL | 1 refills | Status: DC

## 2020-05-27 MED ORDER — ROSUVASTATIN CALCIUM 5 MG PO TABS
5.0000 mg | ORAL_TABLET | Freq: Every day | ORAL | 1 refills | Status: DC
Start: 2020-05-27 — End: 2020-07-21

## 2020-05-27 NOTE — Patient Instructions (Signed)
Medication Instructions:   START Crestor:  Take 1 tablet (5 mg total) by mouth daily.  *If you need a refill on your cardiac medications before your next appointment, please call your pharmacy*   Lab Work: None Ordered If you have labs (blood work) drawn today and your tests are completely normal, you will receive your results only by: Marland Kitchen MyChart Message (if you have MyChart) OR . A paper copy in the mail If you have any lab test that is abnormal or we need to change your treatment, we will call you to review the results.   Testing/Procedures: None Ordered   Follow-Up: At [facility], you and your health needs are our priority.  As part of our continuing mission to provide you with exceptional heart care, we have created designated Provider Care Teams.  These Care Teams include your primary Cardiologist (physician) and Advanced Practice Providers (APPs -  Physician Assistants and Nurse Practitioners) who all work together to provide you with the care you need, when you need it.  We recommend signing up for the patient portal called "MyChart".  Sign up information is provided on this After Visit Summary.  MyChart is used to connect with patients for Virtual Visits (Telemedicine).  Patients are able to view lab/test results, encounter notes, upcoming appointments, etc.  Non-urgent messages can be sent to your provider as well.   To learn more about what you can do with MyChart, go to [email or web address].    Your next appointment:   3-4 month(s)  The format for your next appointment:   In Person  Provider:    You may see Ida Rogue, MD or one of the following Advanced Practice Providers on your designated Care Team:    Murray Hodgkins, NP  Christell Faith, PA-C  Marrianne Mood, PA-C  Laurann Montana, NP    Other Instructions  Rosuvastatin Tablets What is this medicine? ROSUVASTATIN (roe SOO va sta tin) is known as a HMG-CoA reductase inhibitor or 'statin'. It  lowers cholesterol and triglycerides in the blood. This drug may also reduce the risk of heart attack, stroke, or other health problems in patients with risk factors for heart disease. Diet and lifestyle changes are often used with this drug. This medicine may be used for other purposes; ask your health care provider or pharmacist if you have questions. COMMON BRAND NAME(S): Crestor What should I tell my health care provider before I take this medicine? They need to know if you have any of these conditions:  diabetes  if you often drink alcohol  history of stroke  kidney disease  liver disease  muscle aches or weakness  thyroid disease  an unusual or allergic reaction to rosuvastatin, other medicines, foods, dyes, or preservatives  pregnant or trying to get pregnant  breast-feeding How should I use this medicine? Take this medicine by mouth with a glass of water. Follow the directions on the prescription label. Do not cut, crush or chew this medicine. You can take this medicine with or without food. Take your doses at regular intervals. Do not take your medicine more often than directed. Talk to your pediatrician regarding the use of this medicine in children. While this drug may be prescribed for children as young as 1 years old for selected conditions, precautions do apply. Overdosage: If you think you have taken too much of this medicine contact a poison control center or emergency room at once. NOTE: This medicine is only for you. Do not share this medicine with  others. What if I miss a dose? If you miss a dose, take it as soon as you can. If your next dose is to be taken in less than 12 hours, then do not take the missed dose. Take the next dose at your regular time. Do not take double or extra doses. What may interact with this medicine? Do not take this medicine with any of the following medications:  herbal medicines like red yeast rice This medicine may also interact with  the following medications:  alcohol  antacids containing aluminum hydroxide or magnesium hydroxide  cyclosporine  other medicines for high cholesterol  some medicines for HIV infection  warfarin This list may not describe all possible interactions. Give your health care provider a list of all the medicines, herbs, non-prescription drugs, or dietary supplements you use. Also tell them if you smoke, drink alcohol, or use illegal drugs. Some items may interact with your medicine. What should I watch for while using this medicine? Visit your doctor or health care professional for regular check-ups. You may need regular tests to make sure your liver is working properly. Your health care professional may tell you to stop taking this medicine if you develop muscle problems. If your muscle problems do not go away after stopping this medicine, contact your health care professional. Do not become pregnant while taking this medicine. Women should inform their health care professional if they wish to become pregnant or think they might be pregnant. There is a potential for serious side effects to an unborn child. Talk to your health care professional or pharmacist for more information. Do not breast-feed an infant while taking this medicine. This medicine may increase blood sugar. Ask your healthcare provider if changes in diet or medicines are needed if you have diabetes. If you are going to need surgery or other procedure, tell your doctor that you are using this medicine. This drug is only part of a total heart-health program. Your doctor or a dietician can suggest a low-cholesterol and low-fat diet to help. Avoid alcohol and smoking, and keep a proper exercise schedule. This medicine may cause a decrease in Co-Enzyme Q-10. You should make sure that you get enough Co-Enzyme Q-10 while you are taking this medicine. Discuss the foods you eat and the vitamins you take with your health care  professional. What side effects may I notice from receiving this medicine? Side effects that you should report to your doctor or health care professional as soon as possible:  allergic reactions like skin rash, itching or hives, swelling of the face, lips, or tongue  confusion  joint pain  loss of memory  redness, blistering, peeling or loosening of the skin, including inside the mouth  signs and symptoms of high blood sugar such as being more thirsty or hungry or having to urinate more than normal. You may also feel very tired or have blurry vision.  signs and symptoms of muscle injury like dark urine; trouble passing urine or change in the amount of urine; unusually weak or tired; muscle pain or side or back pain  yellowing of the eyes or skin Side effects that usually do not require medical attention (report to your doctor or health care professional if they continue or are bothersome):  constipation  diarrhea  dizziness  gas  headache  nausea  stomach pain  trouble sleeping  upset stomach This list may not describe all possible side effects. Call your doctor for medical advice about side effects. You may  report side effects to FDA at 1-800-FDA-1088. Where should I keep my medicine? Keep out of the reach of children. Store at room temperature between 20 and 25 degrees C (68 and 77 degrees F). Keep container tightly closed (protect from moisture). Throw away any unused medicine after the expiration date. NOTE: This sheet is a summary. It may not cover all possible information. If you have questions about this medicine, talk to your doctor, pharmacist, or health care provider.  2020 Elsevier/Gold Standard (2018-08-09 08:25:08)

## 2020-05-27 NOTE — Progress Notes (Signed)
Office Visit    Patient Name: Gina Mcbride Date of Encounter: 05/27/2020  Primary Care Provider:  Renee Rival, NP Primary Cardiologist:  Ida Rogue, MD Electrophysiologist:  None   Chief Complaint    Gina Mcbride is a 74 y.o. female with a hx of mitral valve prolapse, thrombocytosis, asthma, possible amaurosis fugax with mild left carotid artery stenosis, coronary artery disease, valvular heart disease, palpitations presents today for follow-up after addition of Metoprolol.  Past Medical History    Past Medical History:  Diagnosis Date  . Asthma   . Barlow's syndrome   . COPD (chronic obstructive pulmonary disease) (HCC)    hx - no issues now  . Dysrhythmia    rare - unnamed - followed by PCP, palpatations  . Family history of adverse reaction to anesthesia    Father - severe swelling after open heart surgery  . Osteoporosis   . Palpitations   . Raynaud's disease without gangrene   . Right tennis elbow   . Thrombocytosis ([place])   . Vertigo    yrs ago - no issues now  . Vitamin D deficiency   . Wears dentures    full upper, partial lower   Past Surgical History:  Procedure Laterality Date  . APPENDECTOMY    . BLEPHAROPLASTY    . CATARACT EXTRACTION W/PHACO Right 07/01/2015   Procedure: CATARACT EXTRACTION PHACO AND INTRAOCULAR LENS PLACEMENT ([place]);  Surgeon: Leandrew Koyanagi, MD;  Location: [facility];  Service: Ophthalmology;  Laterality: Right;  . CATARACT EXTRACTION W/PHACO Left 11/15/2017   Procedure: CATARACT EXTRACTION PHACO AND INTRAOCULAR LENS PLACEMENT ([place]) LEFT;  Surgeon: Leandrew Koyanagi, MD;  Location: [facility];  Service: Ophthalmology;  Laterality: Left;  . COLONOSCOPY    . COLONOSCOPY WITH PROPOFOL N/A 06/27/2018   Procedure: COLONOSCOPY WITH PROPOFOL;  Surgeon: Toledo, Benay Pike, MD;  Location: [facility] END[facility];  Service: Gastroenterology;  Laterality: N/A;  . ESOPHAGOGASTRODUODENOSCOPY (EGD) WITH PROPOFOL  N/A 06/27/2018   Procedure: ESOPHAGOGASTRODUODENOSCOPY (EGD) WITH PROPOFOL;  Surgeon: Toledo, Benay Pike, MD;  Location: [facility];  Service: Gastroenterology;  Laterality: N/A;  . TONSILLECTOMY    . TUBAL LIGATION      Allergies  Allergies  Allergen Reactions  . Erythromycin Base   . Other Swelling    Hand contact with raw shrimp  . Oxytetracycline Other (See Comments)    Mouth blisters (all -mycins) Mouth blisters (all -mycins)  . Shrimp [Shellfish Allergy] Swelling    Hand contact with raw shrimp    History of Present Illness    Gina Mcbride is a 74 y.o. female with a hx of mitral valve prolapse, thrombocytosis, asthma, possible amaurosis fugax with mild left carotid artery stenosis, coronary artery disease, valvular heart disease, palpitations last seen 04/29/20.  Clinic visit 03/13/2020 she noted a several year history of episodic diaphoresis, generalized weakness, darkening of vision.  Has been previously attributed to low blood sugars though no history of diabetes.  She also noted episodic chest pain.  Her EKG showed frequent PACs.  She was noted to have been placed on etodolac for joint pain 1 month prior and was recommended to stop aspirin as there was concern it may be contributing to abdominal discomfort.  She was recommended for stress test, ZIO, echocardiogram.  Her stress test was low risk though did show small in size, mild in severity, reversible defect involving basal and mid inferolateral segments most consistent with ischemia.  No significant scar noted.  ZIO monitor  1 for 14 days showed predominantly NSR with occasional PACs and frequent PVCs (PVC burden 27-month) see parentheses.  She had 106 atrial runs lasting up to 13.8 seconds with maximal rate of 207 bpm.  Her triggered events were associated with PACs, PVC, brief atrial runs.  Echo 04/23/2020 LVEF 55 to 60%, and no regional wall motion abnormalities, normal diastolic parameters, mildly elevated PASP with RV  systolic pressure 38 mmHg, LA mildly dilated, mild late systolic prolapse of both leaflets of mitral valve with moderate thickening of mitral valve leaflets and moderate MR without stenosis, mild to moderate TR.  When seen 04/29/20 she was started on Toprol XL 25mg  daily for frequent PVCs. She noted continued palpitations, fatigue, weakness. Her chest pain was associated with her palpitations but not with exertion.   She reports her palpitations have improved since the addition of metoprolol. Reports no pain with her palpitations.  Reports no chest pain, pressure, tightness.  Her fatigue has improved.  Tells me her dyspnea on exertion is about the same.  She will occasionally have a sharp pain in her upper back that occurs both at rest and with activity that self resolves and is short in duration. Tells me it is "more than one pain" lasting about 10-20 minutes.  Tells me she plans to follow-up with her orthopedist.   EKGs/Labs/Other Studies Reviewed:   The following studies were reviewed today:  Echo 04/23/20  1. Left ventricular ejection fraction, by estimation, is 55 to 60%. The  left ventricle has normal function. The left ventricle has no regional  wall motion abnormalities. Left ventricular diastolic parameters were  normal.   2. Right ventricular systolic function is normal. The right ventricular  size is normal. There is mildly elevated pulmonary artery systolic  pressure. The estimated right ventricular systolic pressure is 10.2 mmHg.   3. Left atrial size was mildly dilated.   4. The mitral valve is abnormal. There is mild late systolic prolapse of  both leaflets of the mitral valve. There is moderate thickening of the  mitral valve leaflet(s). Moderate mitral valve regurgitation. No evidence  of mitral stenosis.   5. Tricuspid valve regurgitation is mild to moderate.   6. The aortic valve is normal in structure. Aortic valve regurgitation is  not visualized. Mild aortic valve  sclerosis is present, with no evidence  of aortic valve stenosis.   7. Pulmonic valve regurgitation is moderate.   8. The inferior vena cava is normal in size with <50% respiratory  variability, suggesting right atrial pressure of 8 mmHg.   Monitor 04/16/20  The patient was monitored for 13 days, 15 hours.  The predominant rhythm was sinus with an average rate of 90 bpm (range 68 to 132 bpm in sinus).  There were occasional PACs and frequent PVCs (PVC burden 26%).  106 atrial runs occurred, lasting up to 13.8 seconds with a maximum rate of 207 bpm.  No sustained arrhythmia or prolonged pause was observed.  Patient triggered events correspond to sinus rhythm with PACs, PVCs, and brief atrial runs.   Predominantly sinus rhythm with occasional PACs and frequent PVCs.  Multiple episodes of PSVT occurred, lasting up to 14 seconds.  Lexiscan Myoview 03/31/20  Abnormal pharmacologic myocardial perfusion stress test.  There is a small in size, mild in severity, reversible defect involving the basal and mid inferolateral segments most consistent with ischemia.  There is no significant scar.  The left ventricular ejection fraction is normal (>55%).  Attenuation correction CT shows coronary  artery calcification and aortic atherosclarosis.  This is a low risk study.   EKG:  EKG is  ordered today.  The ekg ordered today demonstrates sinus rhythm 63 bpm with left atrial enlargement. Recent Labs: 03/13/2020: ALT 16; BUN 21; Creatinine, Ser 0.88; Hemoglobin 14.2; Magnesium 2.4; Platelets 272; Potassium 4.5; Sodium 141; TSH 2.310  Recent Lipid Panel No results found for: CHOL, TRIG, HDL, CHOLHDL, VLDL, LDLCALC, LDLDIRECT  Home Medications   Current Meds  Medication Sig  . Calcium Citrate-Vitamin D (CALCIUM + D PO) Take by mouth. lunchtime  . Cholecalciferol (VITAMIN D PO) Take by mouth. lunchtime  . clonazePAM (KLONOPIN) 1 MG tablet Take 1 mg by mouth at bedtime.  Marland Kitchen denosumab (PROLIA) 60  MG/ML SOLN injection Inject 60 mg into the skin every 6 (six) months. Administer in upper arm, thigh, or abdomen  . etodolac (LODINE) 400 MG tablet Take 400 mg by mouth daily.   . metoprolol succinate (TOPROL-XL) 50 MG 24 hr tablet Take 1 tablet (50 mg total) by mouth daily. Take with or immediately following a meal.  . nitroGLYCERIN (NITRO-BID) 2 % ointment Nitro-Bid 2 % transdermal ointment    Review of Systems    Review of Systems  Constitutional: Negative for chills, fever and malaise/fatigue.  Cardiovascular: Positive for dyspnea on exertion and palpitations. Negative for chest pain, leg swelling, near-syncope, orthopnea and syncope.  Respiratory: Negative for cough, shortness of breath and wheezing.   Gastrointestinal: Negative for nausea and vomiting.  Neurological: Negative for dizziness, light-headedness and weakness.   All other systems reviewed and are otherwise negative except as noted above.  Physical Exam   VS:  BP (!) 112/60 (BP Location: Left Arm, Patient Position: Sitting, Cuff Size: Normal)   Pulse 63   Ht 5' 3.5" (1.613 m)   Wt 108 lb (49 kg)   SpO2 96%   BMI 18.83 kg/m  , BMI Body mass index is 18.83 kg/m. GEN: Well nourished, well developed, in no acute distress. HEENT: normal. Neck: Supple, no JVD, carotid bruits, or masses. Cardiac: RRR, no murmurs, rubs, or gallops. No clubbing, cyanosis, edema.  Radials/DP/PT 2+ and equal bilaterally.  Respiratory:  Respirations regular and unlabored, clear to auscultation bilaterally. GI: Soft, nontender, nondistended, BS + x 4. MS: No deformity or atrophy. Skin: Warm and dry, no rash. Neuro:  Strength and sensation are intact. Psych: Normal affect.  Assessment & Plan    1. Palpitations/freqent PVC/Atrial runs/Infrequent PAC - Noted by recent ZIO with PVC burden 26%.  Improvement in palpitations as well as fatigue since addition of Toprol 50 mg daily.  Will defer increased dose due to heart rate of 63 bpm and low  normal BP.  We discussed triggers of palpitations including caffeine, stress.  As her symptoms have improved with the addition of metoprolol we will defer referral to EP.  2. Coronary calcification on CT/aortic atherosclerosis- Lexiscan myoview low risk though did show small area with reversible defect consistent with ischemia.  She reports no chest pain, pressure, tightness.  She has no symptoms concerning for angina, defer additional ischemic evaluation at this time.  As there was coronary calcification as well as aortic atherosclerosis noted on her Lexiscan CT attenuation images we will plan to start Crestor 5 mg daily.  LDL goal less than 70.  She plans to repeat lipid panel with her primary care at upcoming appointment.  Defer addition of low-dose aspirin due to longstanding use of etodolac-encouraged to discuss long-term utilization of NSAID with her primary  care provider.  3. MV prolapse, moderate MR - Noted by recent echo. Continue optimal BP control. No signs of volume overload, no indication for diuretic. Plan for treatment of palpitations, as above.   4. Bilateral carotid stenosis -follows with vascular surgery.  Duplex 10/2019 with bilateral 1-39% stenosis.  Start Crestor 5 mg daily, as above.  Disposition: Follow up in 3 month(s) with Dr. Wallie Renshaw, NP 05/27/2020, 9:38 AM

## 2020-07-21 ENCOUNTER — Other Ambulatory Visit: Payer: Self-pay | Admitting: Cardiovascular Disease

## 2020-07-21 MED ORDER — ROSUVASTATIN CALCIUM 5 MG PO TABS: 5.0000 mg | ORAL_TABLET | Freq: Every day | ORAL | 1 refills | Status: DC

## 2020-07-21 MED ORDER — METOPROLOL SUCCINATE ER 50 MG PO TB24: 50.0000 mg | ORAL_TABLET | Freq: Every day | ORAL | 1 refills | Status: DC

## 2020-07-21 NOTE — Telephone Encounter (Signed)
*  STAT* If patient is at the pharmacy, call can be transferred to refill team.   1. Which medications need to be refilled? (please list name of each medication and dose if known)    Metoprolol 50 mg po q d   Rosuvastatin 5 mg po q d    2. Which pharmacy/location (including street and city if local pharmacy) is medication to [facility] sent [facility]   3. Do they need a 30 day or 90 day supply? [place] [facility]

## 2020-07-23 ENCOUNTER — Other Ambulatory Visit: Payer: Self-pay

## 2020-07-23 MED ORDER — ROSUVASTATIN CALCIUM 5 MG PO TABS: 5.0000 mg | ORAL_TABLET | Freq: Every day | ORAL | 3 refills | Status: DC

## 2020-07-23 MED ORDER — METOPROLOL SUCCINATE ER 50 MG PO TB24: 50.0000 mg | ORAL_TABLET | Freq: Every day | ORAL | 3 refills | Status: DC

## 2020-08-10 ENCOUNTER — Telehealth: Payer: Self-pay | Admitting: Cardiovascular Disease

## 2020-08-10 NOTE — Telephone Encounter (Signed)
Patient calling to discuss episodes of nausea headache and vision changes ( things get dim )   Patient c/o last night episode lasting for 4 hrs.   She wants to know if she should be seen by cards or maybe another specialist to see what is causing symptoms .

## 2020-08-10 NOTE — Telephone Encounter (Signed)
She had an episode last night and one back in April and she gets dim vision changes, weak, and nausea which lasted 4 hours. She did have headache as well but did not last long. Inquired if she had any chest pain, shortness of breath, or any other symptoms and she denied. Recommended that she check with her primary care provider for assessment as this does not sound cardiac in nature. She did have low blood sugar back in May and she did mention her primary care also thought it could be her sugar as well. Advised to check with them and reviewed appointments with Dr. Rockey Situ and Dr. Lucky Cowboy for follow up as well. She verbalized understanding of our conversation, agreement with plan, and no further questions at this time.

## 2020-09-09 ENCOUNTER — Telehealth (INDEPENDENT_AMBULATORY_CARE_PROVIDER_SITE_OTHER): Payer: Self-pay

## 2020-09-09 NOTE — Telephone Encounter (Signed)
Left a message on patient voicemail to return a call to the office 

## 2020-09-09 NOTE — Telephone Encounter (Signed)
Patient was made aware with medical advice and verbalized understanding 

## 2020-09-09 NOTE — Telephone Encounter (Signed)
Typically carotid stenosis will not make you weak or be nauseated.  Her last visit her stenosis was minimal so it is unlikely that is has progressed to a point that it would be concerning for symptoms (which is typically at least 65%).  I would strongly recommend she see her rheumatologist as recommended by her PCP and cardiologist.  We can move up her appointment if there is availability on the schedule.

## 2020-09-28 ENCOUNTER — Encounter (INDEPENDENT_AMBULATORY_CARE_PROVIDER_SITE_OTHER): Payer: Self-pay | Admitting: Nurse Practitioner

## 2020-09-28 ENCOUNTER — Ambulatory Visit (INDEPENDENT_AMBULATORY_CARE_PROVIDER_SITE_OTHER): Payer: Medicare Other

## 2020-09-28 ENCOUNTER — Ambulatory Visit (INDEPENDENT_AMBULATORY_CARE_PROVIDER_SITE_OTHER): Payer: Medicare Other | Admitting: Nurse Practitioner

## 2020-09-28 ENCOUNTER — Other Ambulatory Visit: Payer: Self-pay

## 2020-09-28 VITALS — BP 121/57 | HR 85 | Resp 16 | Wt 111.8 lb

## 2020-09-28 DIAGNOSIS — E782 Mixed hyperlipidemia: Secondary | ICD-10-CM

## 2020-09-28 DIAGNOSIS — I6523 Occlusion and stenosis of bilateral carotid arteries: Secondary | ICD-10-CM | POA: Diagnosis not present

## 2020-09-28 DIAGNOSIS — R42 Dizziness and giddiness: Secondary | ICD-10-CM

## 2020-09-29 ENCOUNTER — Encounter: Payer: Self-pay | Admitting: Cardiovascular Disease

## 2020-09-29 ENCOUNTER — Ambulatory Visit (INDEPENDENT_AMBULATORY_CARE_PROVIDER_SITE_OTHER): Payer: Medicare Other | Admitting: Cardiovascular Disease

## 2020-09-29 VITALS — BP 120/68 | HR 70 | Ht 64.0 in | Wt 111.2 lb

## 2020-09-29 DIAGNOSIS — I739 Peripheral vascular disease, unspecified: Secondary | ICD-10-CM

## 2020-09-29 DIAGNOSIS — I6523 Occlusion and stenosis of bilateral carotid arteries: Secondary | ICD-10-CM

## 2020-09-29 DIAGNOSIS — R002 Palpitations: Secondary | ICD-10-CM | POA: Diagnosis not present

## 2020-09-29 DIAGNOSIS — R42 Dizziness and giddiness: Secondary | ICD-10-CM

## 2020-09-29 DIAGNOSIS — I493 Ventricular premature depolarization: Secondary | ICD-10-CM

## 2020-09-29 NOTE — Patient Instructions (Signed)
Medication Instructions:  No changes  If you need a refill on your cardiac medications before your next appointment, please call your pharmacy.    Lab work: No new labs needed   If you have labs (blood work) drawn today and your tests are completely normal, you will receive your results only by: Marland Kitchen MyChart Message (if you have MyChart) OR . A paper copy in the mail If you have any lab test that is abnormal or we need to change your treatment, we will call you to review the results.   Testing/Procedures: No new testing needed   Follow-Up: At [facility], you and your health needs are our priority.  As part of our continuing mission to provide you with exceptional heart care, we have created designated Provider Care Teams.  These Care Teams include your primary Cardiologist (physician) and Advanced Practice Providers (APPs -  Physician Assistants and Nurse Practitioners) who all work together to provide you with the care you need, when you need it.  . You will need a follow up appointment in 6 months, APP ok  . Providers on your designated Care Team:   . Murray Hodgkins, NP . Christell Faith, PA-C . Marrianne Mood, PA-C  Any Other Special Instructions Will Be Listed Below (If Applicable).  COVID-19 Vaccine Information can be found at: [email or web address] For questions related to vaccine distribution or appointments, please email [email or web address] or call [phone].

## 2020-09-29 NOTE — Progress Notes (Signed)
Cardiology Office Note  Date:  09/29/2020   ID:  Gina, Mcbride 10-Jun-1946, MRN [id number]  PCP:  Renee Rival, NP   Chief Complaint  Patient presents with  . Follow-up    4 month. Meds reviewed by the pt. verbally. Pt. c/o having nausea, blurred vision, headache and dizziness in October; hasn't had anymore spells since.     HPI:  Gina Mcbride is a 74 year old woman with past medical history of Former smoker, quit 1997 (smoked for 30 yrs), COPD Hyperlipidemia osteoporosis,  Anemia, HCT 32 Insomnia Raynaud's manifestation without gangrene, first episode in 1996,  Who presents for follow-up of her lightheadedness, tachycardia, mitral valve disease, shortness of breath and good exercise tolerance  Last seen by myself in clinic April 2019 On last visit had significant stressors at home, has been getting her cardiac surgery, was at rehab recovering Numerous children,  grandchildren and great-grandchildren to assist   Was having very brief lightheadedness episodes, sitting or standing, has to hold onto something, once or twice a week  Rare episodes tachycardia lasting several seconds Prior chest pain but has good exercise tolerance on last visit Was tired, nighttime leg cramping,  Told in the past she had mitral valve disease by Dr. Rebecka Apley echocardiogram in the past, not available  Carotid ultrasound performed yesterday to vascular Results pending  Echocardiogram June 2021 with moderate MR, mild to moderate TR  Monitor showed normal sinus rhythm frequent PVCs 26% burden, occasional PACs, rare atrial runs lasting up to 14 seconds  On metoprolol, much better, less palpitations  In April and October 2021 episodes of nausea headache and vision changes ( things get dim )  episode lasting for 4 hrs.  Stress 03/2020:  Attenuation correction CT shows coronary artery calcification and aortic atherosclarosis.  EKG personally reviewed by myself on todays  visit Shows normal sinus rhythm with rate 70 bpm   PMH:   has a past medical history of Asthma, Barlow's syndrome, COPD (chronic obstructive pulmonary disease) ([place]), Dysrhythmia, Family history of adverse reaction to anesthesia, Osteoporosis, Palpitations, Raynaud's disease without gangrene, Right tennis elbow, Thrombocytosis, Vertigo, Vitamin D deficiency, and Wears dentures.  PSH:    Past Surgical History:  Procedure Laterality Date  . APPENDECTOMY    . BLEPHAROPLASTY    . CATARACT EXTRACTION W/PHACO Right 07/01/2015   Procedure: CATARACT EXTRACTION PHACO AND INTRAOCULAR LENS PLACEMENT ([place]);  Surgeon: Leandrew Koyanagi, MD;  Location: [facility];  Service: Ophthalmology;  Laterality: Right;  . CATARACT EXTRACTION W/PHACO Left 11/15/2017   Procedure: CATARACT EXTRACTION PHACO AND INTRAOCULAR LENS PLACEMENT ([place]) LEFT;  Surgeon: Leandrew Koyanagi, MD;  Location: [place];  Service: Ophthalmology;  Laterality: Left;  . COLONOSCOPY    . COLONOSCOPY WITH PROPOFOL N/A 06/27/2018   Procedure: COLONOSCOPY WITH PROPOFOL;  Surgeon: Toledo, Benay Pike, MD;  Location: [facility];  Service: Gastroenterology;  Laterality: N/A;  . ESOPHAGOGASTRODUODENOSCOPY (EGD) WITH PROPOFOL N/A 06/27/2018   Procedure: ESOPHAGOGASTRODUODENOSCOPY (EGD) WITH PROPOFOL;  Surgeon: Toledo, Benay Pike, MD;  Location: [facility];  Service: Gastroenterology;  Laterality: N/A;  . TONSILLECTOMY    . TUBAL LIGATION      Current Outpatient Medications  Medication Sig Dispense Refill  . Calcium Citrate-Vitamin D (CALCIUM + D PO) Take by mouth. lunchtime    . Cholecalciferol (VITAMIN D PO) Take by mouth. lunchtime    . clonazePAM (KLONOPIN) 1 MG tablet Take 1 mg by mouth at bedtime.    Marland Kitchen denosumab (PROLIA) 60 MG/ML SOLN injection Inject  60 mg into the skin every 6 (six) months. Administer in upper arm, thigh, or abdomen    . etodolac (LODINE) 400 MG tablet Take 400 mg by mouth daily.     . metoprolol  succinate (TOPROL-XL) 50 MG 24 hr tablet Take 1 tablet (50 mg total) by mouth daily. Take with or immediately following a meal. 90 tablet 3  . nitroGLYCERIN (NITRO-BID) 2 % ointment Nitro-Bid 2 % transdermal ointment    . rosuvastatin (CRESTOR) 5 MG tablet Take 1 tablet (5 mg total) by mouth daily. 90 tablet 3   No current facility-administered medications for this visit.    Allergies:   Erythromycin base, Other, Oxytetracycline, and Shrimp [shellfish allergy]   Social History:  The patient  reports that she quit smoking about 25 years ago. Her smoking use included cigarettes. She has a 25.00 pack-year smoking history. She has never used smokeless tobacco. She reports that she does not drink alcohol and does not use drugs.   Family History:   family history includes Breast cancer (age of onset: 48) in her sister.    Review of Systems: Review of Systems  HENT: Negative.   Eyes: Negative.   Respiratory: Negative.   Cardiovascular: Negative.   Gastrointestinal: Negative.   Musculoskeletal: Negative.   Neurological: Negative.   Psychiatric/Behavioral: Negative.   All other systems reviewed and are negative.   PHYSICAL EXAM: VS:  BP 120/68 (BP Location: Left Arm, Patient Position: Sitting, Cuff Size: Normal)   Pulse 70   Ht 5\' 4"  (1.626 m)   Wt 111 lb 4 oz (50.5 kg)   BMI 19.10 kg/m  , BMI Body mass index is 19.1 kg/m. GEN: Well nourished, well developed, in no acute distress , very thin HEENT: normal  Neck: no JVD, carotid bruits, or masses Cardiac: RRR; with ectopy,  no murmurs, rubs, or gallops,no edema  Respiratory:  clear to auscultation bilaterally, normal work of breathing GI: soft, nontender, nondistended, + BS Gina: no deformity or atrophy  Skin: warm and dry, no rash Neuro:  Strength and sensation are intact Psych: euthymic mood, full affect  Recent Labs: 03/13/2020: ALT 16; BUN 21; Creatinine, Ser 0.88; Hemoglobin 14.2; Magnesium 2.4; Platelets 272; Potassium 4.5;  Sodium 141; TSH 2.310    Lipid Panel No results found for: CHOL, HDL, LDLCALC, TRIG    Wt Readings from Last 3 Encounters:  09/29/20 111 lb 4 oz (50.5 kg)  09/28/20 111 lb 12.8 oz (50.7 kg)  05/27/20 108 lb (49 kg)      ASSESSMENT AND PLAN:  Chest pain, unspecified type - Plan: EKG 12-Lead No recent chest pain Stress test June 2021, low risk study normal ejection fraction No further testing at this time  PVC, frequent Improved sx of metoprolol Ectopy still appreciated on exam but she is asymptomatic  Mitral valve disease Moderate MR, at baseline No significant murmur on exam  Mixed hyperlipidemia On crestor  History of smoking Stop smoking many years ago, smoked for 30 years Mild COPD  Anemia, unspecified type stable  Raynaud's disease without gangrene Stable  Shortness of breath Minimal symptoms, recommended regular walking program for conditioning Likely secondary to COPD  Stable, recommend a walking program, she is very sedentary  Anxiety Takes care of husband  Visual changes/H/A Etiology unclear Sx resolved, could consider head CT for recurrance   Total encounter time more than 25 minutes  Greater than 50% was spent in counseling and coordination of care with the patient   No orders of  the defined types were placed in this encounter.    Signed, Esmond Plants, M.D., Ph.D. 09/29/2020  [place], [place]

## 2020-10-01 ENCOUNTER — Encounter (INDEPENDENT_AMBULATORY_CARE_PROVIDER_SITE_OTHER): Payer: Self-pay | Admitting: Nurse Practitioner

## 2020-10-01 NOTE — Progress Notes (Signed)
Subjective:    Patient ID: Terressa Koyanagi, female    DOB: Jun 10, 1946, 74 y.o.   MRN: [id number] Chief Complaint  Patient presents with  . Follow-up    ultrasound follow up    The patient is seen for follow up evaluation of carotid stenosis. The carotid stenosis followed by ultrasound.   The patient denies amaurosis fugax. There is no recent history of TIA symptoms or focal motor deficits. There is no prior documented CVA.  The patient does however report that she has had 2 episodes of having lightheaded and weakness.  The most recent one was the worst.  Her first episode only lasted 45 minutes whereas the second 1 lasted for about 4 hours.  She notes that following the lightheaded weakness and notes that the room began spinning and she felt very shaky and nauseated.  She noted that she had a headache and it seemed as if the lights dimmed in the room.  She denies any focal narrowing of vision however.  The patient is taking enteric-coated aspirin 81 mg daily.  There is no history of migraine headaches. There is no history of seizures.  The patient has a history of coronary artery disease, no recent episodes of angina or shortness of breath. The patient denies PAD or claudication symptoms. There is a history of hyperlipidemia which is being treated with a statin.    Carotid Duplex done today shows 1 to 39% bilaterally.  No change compared to last study in 10/18/2019   Review of Systems  Cardiovascular: Positive for palpitations.  Neurological: Positive for light-headedness.  All other systems reviewed and are negative.      Objective:   Physical Exam Vitals reviewed.  HENT:     Head: Normocephalic.  Neck:     Vascular: No carotid bruit.  Cardiovascular:     Rate and Rhythm: Normal rate.     Pulses: Normal pulses.  Pulmonary:     Effort: Pulmonary effort is normal.  Skin:    General: Skin is warm and dry.  Neurological:     Mental Status: She is alert and oriented to  person, place, and time.  Psychiatric:        Mood and Affect: Mood normal.        Behavior: Behavior normal.        Thought Content: Thought content normal.        Judgment: Judgment normal.     BP (!) 121/57 (BP Location: Left Arm)   Pulse 85   Resp 16   Wt 111 lb 12.8 oz (50.7 kg)   BMI 19.49 kg/m   Past Medical History:  Diagnosis Date  . Asthma   . Barlow's syndrome   . COPD (chronic obstructive pulmonary disease) ([facility])    hx - no issues now  . Dysrhythmia    rare - unnamed - followed by PCP, palpatations  . Family history of adverse reaction to anesthesia    Father - severe swelling after open heart surgery  . Osteoporosis   . Palpitations   . Raynaud's disease without gangrene   . Right tennis elbow   . Thrombocytosis   . Vertigo    yrs ago - no issues now  . Vitamin D deficiency   . Wears dentures    full upper, partial lower    Social History   Socioeconomic History  . Marital status: Married    Spouse name: Not on file  . Number of children: Not on file  .  Years of education: Not on file  . Highest education level: Not on file  Occupational History  . Not on file  Tobacco Use  . Smoking status: Former Smoker    Packs/day: 1.00    Years: 25.00    Pack years: 25.00    Types: Cigarettes    Quit date: 10/31/1994    Years since quitting: 25.9  . Smokeless tobacco: Never Used  Vaping Use  . Vaping Use: Never used  Substance and Sexual Activity  . Alcohol use: No  . Drug use: Never  . Sexual activity: Not on file  Other Topics Concern  . Not on file  Social History Narrative  . Not on file   Social Determinants of Health   Financial Resource Strain:   . Difficulty of Paying Living Expenses: Not on file  Food Insecurity:   . Worried About Charity fundraiser in the Last Year: Not on file  . Ran Out of Food in the Last Year: Not on file  Transportation Needs:   . Lack of Transportation (Medical): Not on file  . Lack of Transportation  (Non-Medical): Not on file  Physical Activity:   . Days of Exercise per Week: Not on file  . Minutes of Exercise per Session: Not on file  Stress:   . Feeling of Stress : Not on file  Social Connections:   . Frequency of Communication with Friends and Family: Not on file  . Frequency of Social Gatherings with Friends and Family: Not on file  . Attends Religious Services: Not on file  . Active Member of Clubs or Organizations: Not on file  . Attends Archivist Meetings: Not on file  . Marital Status: Not on file  Intimate Partner Violence:   . Fear of Current or Ex-Partner: Not on file  . Emotionally Abused: Not on file  . Physically Abused: Not on file  . Sexually Abused: Not on file    Past Surgical History:  Procedure Laterality Date  . APPENDECTOMY    . BLEPHAROPLASTY    . CATARACT EXTRACTION W/PHACO Right 07/01/2015   Procedure: CATARACT EXTRACTION PHACO AND INTRAOCULAR LENS PLACEMENT ([place]);  Surgeon: Leandrew Koyanagi, MD;  Location: [facility];  Service: Ophthalmology;  Laterality: Right;  . CATARACT EXTRACTION W/PHACO Left 11/15/2017   Procedure: CATARACT EXTRACTION PHACO AND INTRAOCULAR LENS PLACEMENT ([place]) LEFT;  Surgeon: Leandrew Koyanagi, MD;  Location: [place];  Service: Ophthalmology;  Laterality: Left;  . COLONOSCOPY    . COLONOSCOPY WITH PROPOFOL N/A 06/27/2018   Procedure: COLONOSCOPY WITH PROPOFOL;  Surgeon: Toledo, Benay Pike, MD;  Location: [facility];  Service: Gastroenterology;  Laterality: N/A;  . ESOPHAGOGASTRODUODENOSCOPY (EGD) WITH PROPOFOL N/A 06/27/2018   Procedure: ESOPHAGOGASTRODUODENOSCOPY (EGD) WITH PROPOFOL;  Surgeon: Toledo, Benay Pike, MD;  Location: [facility];  Service: Gastroenterology;  Laterality: N/A;  . TONSILLECTOMY    . TUBAL LIGATION      Family History  Problem Relation Age of Onset  . Breast cancer Sister 16    Allergies  Allergen Reactions  . Erythromycin Base   . Other Swelling    Hand  contact with raw shrimp  . Oxytetracycline Other (See Comments)    Mouth blisters (all -mycins) Mouth blisters (all -mycins)  . Shrimp [Shellfish Allergy] Swelling    Hand contact with raw shrimp    CBC Latest Ref Rng & Units 03/13/2020 07/16/2012  WBC 4.0 - 10.5 K/uL 5.9 8.6  Hemoglobin 12.0 - 15.0 g/dL 14.2 13.7  Hematocrit 36 - 46 % 44.5 41.6  Platelets 150 - 400 K/uL 272 221      CMP     Component Value Date/Time   NA 141 03/13/2020 1645   NA 141 07/16/2012 1202   K 4.5 03/13/2020 1645   K 4.0 07/16/2012 1202   CL 106 03/13/2020 1645   CL 106 07/16/2012 1202   CO2 27 03/13/2020 1645   CO2 29 07/16/2012 1202   GLUCOSE 79 03/13/2020 1645   GLUCOSE 90 07/16/2012 1202   BUN 21 03/13/2020 1645   BUN 11 07/16/2012 1202   CREATININE 0.88 03/13/2020 1645   CREATININE 0.72 07/16/2012 1202   CALCIUM 9.7 03/13/2020 1645   CALCIUM 9.2 07/16/2012 1202   PROT 7.2 03/13/2020 1645   PROT 7.1 07/16/2012 1202   ALBUMIN 4.0 03/13/2020 1645   ALBUMIN 3.9 07/16/2012 1202   AST 22 03/13/2020 1645   AST 27 07/16/2012 1202   ALT 16 03/13/2020 1645   ALT 20 07/16/2012 1202   ALKPHOS 61 03/13/2020 1645   ALKPHOS 81 07/16/2012 1202   BILITOT 0.8 03/13/2020 1645   BILITOT 0.5 07/16/2012 1202   GFRNONAA >60 03/13/2020 1645   GFRNONAA >60 07/16/2012 1202   GFRAA >60 03/13/2020 1645   GFRAA >60 07/16/2012 1202     No results found.     Assessment & Plan:   1. Bilateral carotid artery stenosis Recommend:  Given the patient's asymptomatic subcritical stenosis no further invasive testing or surgery at this time.  Duplex ultrasound shows less than 40% stenosis bilaterally.  Continue antiplatelet therapy as prescribed Continue management of CAD, HTN and Hyperlipidemia Healthy heart diet,  encouraged exercise at least 4 times per week Follow up in 12 months with duplex ultrasound and physical exam   2. Mixed hyperlipidemia Continue statin as ordered and reviewed, no changes at  this time   3. Lightheaded The patient's reported episodes of lightheadedness and weakness could have several different etiologies.  However, the noninvasive studies do not obviously suggest carotid artery disease however a more accurate assessment would be with a CT angiogram.  As the patient's symptoms have currently resolved, we will not order a CT angiogram at this time, in addition the patient has an upcoming appointment with her cardiologist for evaluation as well.  In the event that the cardiology work-up was uneventful we can order a CT angiogram for further evaluation based upon the patient's wishes.  The patient is also advised that if her symptoms recur, it would be in her best interest to proceed to the emergency room for immediate work-up and evaluation.   Current Outpatient Medications on File Prior to Visit  Medication Sig Dispense Refill  . Calcium Citrate-Vitamin D (CALCIUM + D PO) Take by mouth. lunchtime    . Cholecalciferol (VITAMIN D PO) Take by mouth. lunchtime    . clonazePAM (KLONOPIN) 1 MG tablet Take 1 mg by mouth at bedtime.    Marland Kitchen denosumab (PROLIA) 60 MG/ML SOLN injection Inject 60 mg into the skin every 6 (six) months. Administer in upper arm, thigh, or abdomen    . etodolac (LODINE) 400 MG tablet Take 400 mg by mouth daily.     . metoprolol succinate (TOPROL-XL) 50 MG 24 hr tablet Take 1 tablet (50 mg total) by mouth daily. Take with or immediately following a meal. 90 tablet 3  . nitroGLYCERIN (NITRO-BID) 2 % ointment Nitro-Bid 2 % transdermal ointment    . rosuvastatin (CRESTOR) 5 MG tablet Take 1 tablet (  5 mg total) by mouth daily. 90 tablet 3   No current facility-administered medications on file prior to visit.    There are no Patient Instructions on file for this visit. No follow-ups on file.   Kris Hartmann, NP

## 2020-10-20 ENCOUNTER — Ambulatory Visit (INDEPENDENT_AMBULATORY_CARE_PROVIDER_SITE_OTHER): Payer: Medicare Other | Admitting: Vascular Surgery

## 2020-10-20 ENCOUNTER — Encounter (INDEPENDENT_AMBULATORY_CARE_PROVIDER_SITE_OTHER): Payer: Medicare Other

## 2021-01-11 ENCOUNTER — Other Ambulatory Visit: Payer: Self-pay | Admitting: Nurse Practitioner

## 2021-01-11 DIAGNOSIS — Z1231 Encounter for screening mammogram for malignant neoplasm of breast: Secondary | ICD-10-CM

## 2021-01-29 ENCOUNTER — Other Ambulatory Visit: Payer: Self-pay

## 2021-01-29 ENCOUNTER — Ambulatory Visit
Admission: RE | Admit: 2021-01-29 | Discharge: 2021-01-29 | Disposition: A | Discharge status: Home / Self Care | Payer: Medicare Other | Source: Ambulatory Visit | Attending: Nurse Practitioner | Admitting: Nurse Practitioner

## 2021-01-29 DIAGNOSIS — Z1231 Encounter for screening mammogram for malignant neoplasm of breast: Secondary | ICD-10-CM | POA: Insufficient documentation

## 2021-03-29 NOTE — Progress Notes (Signed)
Cardiology Office Note  Date:  03/30/2021   ID:  Gina, Mcbride 07/23/1946, MRN [id number]  PCP:  Renee Rival, NP   Chief Complaint  Patient presents with  . 6 month follow up     Patient c/o weakness and breaking out into sweats. Medications reviewed by the patient verbally.     HPI:  Ms Gina Mcbride is a 75 year old woman with past medical history of Former smoker, quit 1997 (smoked for 30 yrs), COPD Hyperlipidemia osteoporosis,  Anemia, HCT 32 Insomnia Raynaud's manifestation without gangrene, first episode in 1996,  Who presents for follow-up of her lightheadedness, tachycardia, mitral valve disease, shortness of breath and good exercise tolerance  Last seen in clinic November 2021 Chronic lightheadedness, not as much now Numerous children,  grandchildren and great-grandchildren to assist   Does her own shopping, church "Poor sleep"  No recent sx of tachypalpitations  episodes of weakness, sweating 20 min First feels "warmth, then weakness" Wraps something cold around her Drinks water, eats candy, Time of onset varies Does not feel palpitations  EKG personally reviewed by myself on todays visit Shows NSR rate 68 bpm, no ST or T wave changes  prior records reviewed  stress test 03/2020  was low risk though did show small in size, mild in severity, reversible defect involving basal and mid inferolateral segments most consistent with ischemia.  No significant scar noted.    ZIO monitor 1 for 14 days showed predominantly NSR with occasional PACs and frequent PVCs (PVC burden 80-month) see parentheses.  She had 106 atrial runs lasting up to 13.8 seconds with maximal rate of 207 bpm.  Her triggered events were associated with PACs, PVC, brief atrial runs.    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  April and October 2021 episodes of nausea headache and vision changes ( things get dim )  episode lasting for 4 hrs.  Stress 03/2020:  Attenuation correction CT shows coronary artery calcification and aortic atherosclarosis.  PMH:   has a past medical history of Asthma, Barlow's syndrome, COPD (chronic obstructive pulmonary disease) ([place]), Dysrhythmia, Family history of adverse reaction to anesthesia, Osteoporosis, Palpitations, Raynaud's disease without gangrene, Right tennis elbow, Thrombocytosis, Vertigo, Vitamin D deficiency, and Wears dentures.  PSH:    Past Surgical History:  Procedure Laterality Date  . APPENDECTOMY    . BLEPHAROPLASTY    . CATARACT EXTRACTION W/PHACO Right 07/01/2015   Procedure: CATARACT EXTRACTION PHACO AND INTRAOCULAR LENS PLACEMENT ([place]);  Surgeon: Leandrew Koyanagi, MD;  Location: [place];  Service: Ophthalmology;  Laterality: Right;  . CATARACT EXTRACTION W/PHACO Left 11/15/2017   Procedure: CATARACT EXTRACTION PHACO AND INTRAOCULAR LENS PLACEMENT ([place]) LEFT;  Surgeon: Leandrew Koyanagi, MD;  Location: [facility];  Service: Ophthalmology;  Laterality: Left;  . COLONOSCOPY    . COLONOSCOPY WITH PROPOFOL N/A 06/27/2018   Procedure: COLONOSCOPY WITH PROPOFOL;  Surgeon: Toledo, Benay Pike, MD;  Location: [facility];  Service: Gastroenterology;  Laterality: N/A;  . ESOPHAGOGASTRODUODENOSCOPY (EGD) WITH PROPOFOL N/A 06/27/2018   Procedure: ESOPHAGOGASTRODUODENOSCOPY (EGD) WITH PROPOFOL;  Surgeon: Toledo, Benay Pike, MD;  Location: [facility];  Service: Gastroenterology;  Laterality: N/A;  . TONSILLECTOMY    . TUBAL LIGATION  Current Outpatient Medications  Medication Sig Dispense Refill  . Calcium Citrate-Vitamin D (CALCIUM + D PO) Take by mouth. lunchtime    . Cholecalciferol  (VITAMIN D PO) Take by mouth. lunchtime    . clonazePAM (KLONOPIN) 1 MG tablet Take 1 mg by mouth at bedtime.    Marland Kitchen denosumab (PROLIA) 60 MG/ML SOLN injection Inject 60 mg into the skin every 6 (six) months. Administer in upper arm, thigh, or abdomen    . etodolac (LODINE) 400 MG tablet Take 400 mg by mouth daily.     . metoprolol succinate (TOPROL-XL) 50 MG 24 hr tablet Take 1 tablet (50 mg total) by mouth daily. Take with or immediately following a meal. 90 tablet 3  . nitroGLYCERIN (NITROGLYN) 2 % ointment Nitro-Bid 2 % transdermal ointment    . rosuvastatin (CRESTOR) 5 MG tablet Take 1 tablet (5 mg total) by mouth daily. 90 tablet 3   No current facility-administered medications for this visit.    Allergies:   Erythromycin base, Other, Oxytetracycline, and Shrimp [shellfish allergy]   Social History:  The patient  reports that she quit smoking about 26 years ago. Her smoking use included cigarettes. She has a 25.00 pack-year smoking history. She has never used smokeless tobacco. She reports that she does not drink alcohol and does not use drugs.   Family History:   family history includes Breast cancer (age of onset: 30) in her sister.    Review of Systems: Review of Systems  HENT: Negative.   Eyes: Negative.   Respiratory: Negative.   Cardiovascular: Negative.   Gastrointestinal: Negative.   Musculoskeletal: Negative.   Neurological: Negative.   Psychiatric/Behavioral: Negative.   All other systems reviewed and are negative.   PHYSICAL EXAM: VS:  BP 130/70 (BP Location: Left Arm, Patient Position: Sitting, Cuff Size: Normal)   Pulse 68   Ht 5' 3.5" (1.613 m)   Wt 116 lb 2 oz (52.7 kg)   SpO2 98%   BMI 20.25 kg/m  , BMI Body mass index is 20.25 kg/m. GEN: Well nourished, well developed, in no acute distress , very thin HEENT: normal  Neck: no JVD, carotid bruits, or masses Cardiac: RRR; with ectopy,  no murmurs, rubs, or gallops,no edema  Respiratory:  clear to  auscultation bilaterally, normal work of breathing GI: soft, nontender, nondistended, + BS MS: no deformity or atrophy  Skin: warm and dry, no rash Neuro:  Strength and sensation are intact Psych: euthymic mood, full affect  Recent Labs: No results found for requested labs within last 8760 hours.    Lipid Panel No results found for: CHOL, HDL, LDLCALC, TRIG    Wt Readings from Last 3 Encounters:  03/30/21 116 lb 2 oz (52.7 kg)  09/29/20 111 lb 4 oz (50.5 kg)  09/28/20 111 lb 12.8 oz (50.7 kg)      ASSESSMENT AND PLAN:  Chest pain, unspecified type - Plan: EKG 12-Lead No recent chest pain Stress test June 2021, low risk study normal ejection fraction Doing well today  PVC, frequent Improved sx of metoprolol stable  Mitral valve disease Moderate MR, at baseline No sx  Mixed hyperlipidemia On crestor  History of smoking Stopped smoking many years ago, smoked for 30 years Mild COPD, active  Anemia, unspecified type No recent labs, scheduled to see PMD  Raynaud's disease without gangrene Stable  Shortness of breath Minimal symptoms, recommended regular walking program for conditioning Likely secondary to COPD  Stable, recommend a walking program, she is very  sedentary  Anxiety Takes care of husband  Visual changes/H/A Etiology unclear Sx resolved, could consider head CT for recurrance   Total encounter time more than 25 minutes  Greater than 50% was spent in counseling and coordination of care with the patient   Orders Placed This Encounter  Procedures  . EKG 12-Lead     Signed, Esmond Plants, M.D., Ph.D. 03/30/2021  [place], [place]

## 2021-03-30 ENCOUNTER — Ambulatory Visit (INDEPENDENT_AMBULATORY_CARE_PROVIDER_SITE_OTHER): Payer: Medicare Other | Admitting: Cardiovascular Disease

## 2021-03-30 ENCOUNTER — Encounter: Payer: Self-pay | Admitting: Cardiovascular Disease

## 2021-03-30 ENCOUNTER — Other Ambulatory Visit: Payer: Self-pay

## 2021-03-30 VITALS — BP 130/70 | HR 68 | Ht 63.5 in | Wt 116.1 lb

## 2021-03-30 DIAGNOSIS — I700 Atherosclerosis of aorta: Secondary | ICD-10-CM | POA: Diagnosis not present

## 2021-03-30 DIAGNOSIS — I739 Peripheral vascular disease, unspecified: Secondary | ICD-10-CM | POA: Diagnosis not present

## 2021-03-30 DIAGNOSIS — R0600 Dyspnea, unspecified: Secondary | ICD-10-CM

## 2021-03-30 DIAGNOSIS — I6523 Occlusion and stenosis of bilateral carotid arteries: Secondary | ICD-10-CM | POA: Diagnosis not present

## 2021-03-30 DIAGNOSIS — I493 Ventricular premature depolarization: Secondary | ICD-10-CM

## 2021-03-30 DIAGNOSIS — R0609 Other forms of dyspnea: Secondary | ICD-10-CM

## 2021-03-30 MED ORDER — ROSUVASTATIN CALCIUM 5 MG PO TABS: 5.0000 mg | ORAL_TABLET | Freq: Every day | ORAL | 3 refills | Status: AC

## 2021-03-30 MED ORDER — METOPROLOL SUCCINATE ER 50 MG PO TB24: 50.0000 mg | ORAL_TABLET | Freq: Every day | ORAL | 3 refills | Status: DC

## 2021-03-30 NOTE — Patient Instructions (Signed)
Medication Instructions:  No changes  If you need a refill on your cardiac medications before your next appointment, please call your pharmacy.    Lab work: No new labs needed   If you have labs (blood work) drawn today and your tests are completely normal, you will receive your results only by: . MyChart Message (if you have MyChart) OR . A paper copy in the mail If you have any lab test that is abnormal or we need to change your treatment, we will call you to review the results.   Testing/Procedures: No new testing needed   Follow-Up: At [facility], you and your health needs are our priority.  As part of our continuing mission to provide you with exceptional heart care, we have created designated Provider Care Teams.  These Care Teams include your primary Cardiologist (physician) and Advanced Practice Providers (APPs -  Physician Assistants and Nurse Practitioners) who all work together to provide you with the care you need, when you need it.  . You will need a follow up appointment in 12 months  . Providers on your designated Care Team:   . Christopher Berge, NP . Ryan Dunn, PA-C . Jacquelyn Visser, PA-C  Any Other Special Instructions Will Be Listed Below (If Applicable).  COVID-19 Vaccine Information can be found at: [email or web address] For questions related to vaccine distribution or appointments, please email [email or web address] or call [phone].     

## 2021-09-21 ENCOUNTER — Other Ambulatory Visit: Payer: Self-pay

## 2021-09-21 ENCOUNTER — Other Ambulatory Visit (INDEPENDENT_AMBULATORY_CARE_PROVIDER_SITE_OTHER): Payer: Self-pay | Admitting: Nurse Practitioner

## 2021-09-21 ENCOUNTER — Ambulatory Visit (INDEPENDENT_AMBULATORY_CARE_PROVIDER_SITE_OTHER): Payer: Medicare Other | Admitting: Vascular Surgery

## 2021-09-21 ENCOUNTER — Ambulatory Visit (INDEPENDENT_AMBULATORY_CARE_PROVIDER_SITE_OTHER): Payer: Medicare Other

## 2021-09-21 VITALS — BP 133/68 | HR 67 | Ht 64.0 in | Wt 114.0 lb

## 2021-09-21 DIAGNOSIS — I6523 Occlusion and stenosis of bilateral carotid arteries: Secondary | ICD-10-CM

## 2021-09-21 DIAGNOSIS — E782 Mixed hyperlipidemia: Secondary | ICD-10-CM

## 2021-09-21 DIAGNOSIS — I7300 Raynaud's syndrome without gangrene: Secondary | ICD-10-CM | POA: Diagnosis not present

## 2021-09-21 NOTE — Progress Notes (Signed)
MRN : [id number]  Gina Mcbride is a 75 y.o. (August 20, 1946) female who presents with chief complaint of  Chief Complaint  Patient presents with   Follow-up    1 yr carotid  .  History of Present Illness: Patient returns in follow-up of her carotid disease.  With the turn in the cold weather here recently, her Raynaud's symptoms have been bothering her more.  She has not had any focal neurologic symptoms or concerns from her carotid disease. Specifically, the patient denies amaurosis fugax, speech or swallowing difficulties, or arm or leg weakness or numbness.  Carotid duplex today reveals stable 1 to 39% ICA stenosis bilaterally without progression from previous studies.  Current Outpatient Medications  Medication Sig Dispense Refill   Calcium Citrate-Vitamin D (CALCIUM + D PO) Take by mouth. lunchtime     celecoxib (CELEBREX) 200 MG capsule Take by mouth.     Cholecalciferol (VITAMIN D PO) Take by mouth. lunchtime     clonazePAM (KLONOPIN) 1 MG tablet Take 1 mg by mouth at bedtime.     denosumab (PROLIA) 60 MG/ML SOLN injection Inject 60 mg into the skin every 6 (six) months. Administer in upper arm, thigh, or abdomen     etodolac (LODINE) 400 MG tablet Take 400 mg by mouth daily.      metoprolol succinate (TOPROL-XL) 50 MG 24 hr tablet Take 1 tablet (50 mg total) by mouth daily. Take with or immediately following a meal. 90 tablet 3   rosuvastatin (CRESTOR) 5 MG tablet Take 1 tablet (5 mg total) by mouth daily. 90 tablet 3   No current facility-administered medications for this visit.    Past Medical History:  Diagnosis Date   Asthma    Barlow's syndrome    COPD (chronic obstructive pulmonary disease) (HCC)    hx - no issues now   Dysrhythmia    rare - unnamed - followed by PCP, palpatations   Family history of adverse reaction to anesthesia    Father - severe swelling after open heart surgery   Osteoporosis    Palpitations    Raynaud's disease without gangrene    Right  tennis elbow    Thrombocytosis    Vertigo    yrs ago - no issues now   Vitamin D deficiency    Wears dentures    full upper, partial lower    Past Surgical History:  Procedure Laterality Date   APPENDECTOMY     BLEPHAROPLASTY     CATARACT EXTRACTION W/PHACO Right 07/01/2015   Procedure: CATARACT EXTRACTION PHACO AND INTRAOCULAR LENS PLACEMENT ([place]);  Surgeon: Leandrew Koyanagi, MD;  Location: [facility];  Service: Ophthalmology;  Laterality: Right;   CATARACT EXTRACTION W/PHACO Left 11/15/2017   Procedure: CATARACT EXTRACTION PHACO AND INTRAOCULAR LENS PLACEMENT ([place]) LEFT;  Surgeon: Leandrew Koyanagi, MD;  Location: [facility];  Service: Ophthalmology;  Laterality: Left;   COLONOSCOPY     COLONOSCOPY WITH PROPOFOL N/A 06/27/2018   Procedure: COLONOSCOPY WITH PROPOFOL;  Surgeon: Toledo, Benay Pike, MD;  Location: [facility];  Service: Gastroenterology;  Laterality: N/A;   ESOPHAGOGASTRODUODENOSCOPY (EGD) WITH PROPOFOL N/A 06/27/2018   Procedure: ESOPHAGOGASTRODUODENOSCOPY (EGD) WITH PROPOFOL;  Surgeon: Toledo, Benay Pike, MD;  Location: [facility];  Service: Gastroenterology;  Laterality: N/A;   TONSILLECTOMY     TUBAL LIGATION       Social History   Tobacco Use   Smoking status: Former    Packs/day: 1.00    Years: 25.00    Pack  years: 25.00    Types: Cigarettes    Quit date: 10/31/1994    Years since quitting: 26.9   Smokeless tobacco: Never  Vaping Use   Vaping Use: Never used  Substance Use Topics   Alcohol use: No   Drug use: Never      Family History  Problem Relation Age of Onset   Breast cancer Sister 80     Allergies  Allergen Reactions   Erythromycin Base    Other Swelling    Hand contact with raw shrimp   Oxytetracycline Other (See Comments)    Mouth blisters (all -mycins) Mouth blisters (all -mycins)   Shrimp [Shellfish Allergy] Swelling    Hand contact with raw shrimp    REVIEW OF SYSTEMS (Negative unless checked)    Constitutional: [] Weight loss  [] Fever  [] Chills Cardiac: [] Chest pain   [] Chest pressure   [] Palpitations   [] Shortness of breath when laying flat   [] Shortness of breath at rest   [] Shortness of breath with exertion. Vascular:  [] Pain in legs with walking   [] Pain in legs at rest   [] Pain in legs when laying flat   [] Claudication   [] Pain in feet when walking  [] Pain in feet at rest  [] Pain in feet when laying flat   [] History of DVT   [] Phlebitis   [] Swelling in legs   [] Varicose veins   [] Non-healing ulcers Pulmonary:   [] Uses home oxygen   [] Productive cough   [] Hemoptysis   [] Wheeze  [x] COPD   [] Asthma Neurologic:  [x] Dizziness  [] Blackouts   [] Seizures   [] History of stroke   [] History of TIA  [] Aphasia   [x] Temporary blindness   [] Dysphagia   [] Weakness or numbness in arms   [] Weakness or numbness in legs Musculoskeletal:  [x] Arthritis   [] Joint swelling   [x] Joint pain   [] Low back pain Hematologic:  [] Easy bruising  [] Easy bleeding   [] Hypercoagulable state   [x] Anemic  [] Hepatitis Gastrointestinal:  [] Blood in stool   [] Vomiting blood  [x] Gastroesophageal reflux/heartburn   [] Abdominal pain Genitourinary:  [] Chronic kidney disease   [] Difficult urination  [] Frequent urination  [] Burning with urination   [] Hematuria Skin:  [] Rashes   [] Ulcers   [] Wounds Psychological:  [] History of anxiety   []  History of major depression.  Physical Examination  Vitals:   09/21/21 0853  BP: 133/68  Pulse: 67  Weight: 114 lb (51.7 kg)  Height: 5\' 4"  (1.626 m)   Body mass index is 19.57 kg/m. Gen:  WD/WN, NAD Head: [place]/AT, No temporalis wasting. Ear/Nose/Throat: Hearing grossly intact, nares w/o erythema or drainage, trachea midline Eyes: Conjunctiva clear. Sclera non-icteric Neck: Supple.  No bruit  Pulmonary:  Good air movement, equal and clear to auscultation bilaterally.  Cardiac: RRR, No JVD Vascular:  Vessel Right Left  Radial Palpable Palpable       Musculoskeletal: M/S 5/5  throughout.  No deformity or atrophy. No edema. Neurologic: CN 2-12 intact. Sensation grossly intact in extremities.  Symmetrical.  Speech is fluent. Motor exam as listed above. Psychiatric: Judgment intact, Mood & affect appropriate for pt's clinical situation. Dermatologic: No rashes or ulcers noted.  No cellulitis or open wounds.     CBC Lab Results  Component Value Date   WBC 5.9 03/13/2020   HGB 14.2 03/13/2020   HCT 44.5 03/13/2020   MCV 94.9 03/13/2020   PLT 272 03/13/2020    BMET    Component Value Date/Time   NA 141 03/13/2020 1645   NA 141 07/16/2012 1202  K 4.5 03/13/2020 1645   K 4.0 07/16/2012 1202   CL 106 03/13/2020 1645   CL 106 07/16/2012 1202   CO2 27 03/13/2020 1645   CO2 29 07/16/2012 1202   GLUCOSE 79 03/13/2020 1645   GLUCOSE 90 07/16/2012 1202   BUN 21 03/13/2020 1645   BUN 11 07/16/2012 1202   CREATININE 0.88 03/13/2020 1645   CREATININE 0.72 07/16/2012 1202   CALCIUM 9.7 03/13/2020 1645   CALCIUM 9.2 07/16/2012 1202   GFRNONAA >60 03/13/2020 1645   GFRNONAA >60 07/16/2012 1202   GFRAA >60 03/13/2020 1645   GFRAA >60 07/16/2012 1202   CrCl cannot be calculated (Patient's most recent lab result is older than the maximum 21 days allowed.).  COAG Lab Results  Component Value Date   INR 0.9 07/16/2012    Radiology No results found.   Assessment/Plan Mixed hyperlipidemia lipid control important in reducing the progression of atherosclerotic disease.    Raynaud's disease without gangrene Symptoms fairly mild at this point although more noticeable in the cold weather.  Carotid stenosis Her duplex today shows mild 1 to 39% carotid artery stenosis bilaterally without significant progression from her previous study. No change in medical regimen at this time other than an aspirin daily.  Follow-up on an annual basis for now.   Leotis Pain, MD  09/21/2021 9:39 AM    This note was created with Dragon medical transcription system.  Any  errors from dictation are purely unintentional

## 2021-10-01 ENCOUNTER — Telehealth: Payer: Self-pay | Admitting: Cardiovascular Disease

## 2021-10-01 NOTE — Telephone Encounter (Signed)
Patient needs to know when she started taking Metoprolol. This is for insurance purposes. Please call to discuss.

## 2021-10-01 NOTE — Telephone Encounter (Signed)
Spoke with pt. Notified that she was started on Metoprolol Succinate 04/29/2020 per chart.  Pt appreciative of information and has no further questions.

## 2021-12-05 ENCOUNTER — Other Ambulatory Visit: Payer: Self-pay

## 2021-12-05 ENCOUNTER — Encounter: Payer: Self-pay | Admitting: Emergency Medicine

## 2021-12-05 ENCOUNTER — Emergency Department: Payer: Medicare Other

## 2021-12-05 ENCOUNTER — Emergency Department
Admission: EM | Admit: 2021-12-05 | Discharge: 2021-12-05 | Disposition: A | Discharge status: Home / Self Care | Payer: Medicare Other | Attending: Emergency Medicine | Admitting: Emergency Medicine

## 2021-12-05 DIAGNOSIS — R0789 Other chest pain: Secondary | ICD-10-CM | POA: Diagnosis not present

## 2021-12-05 DIAGNOSIS — R197 Diarrhea, unspecified: Secondary | ICD-10-CM | POA: Diagnosis present

## 2021-12-05 DIAGNOSIS — J449 Chronic obstructive pulmonary disease, unspecified: Secondary | ICD-10-CM | POA: Insufficient documentation

## 2021-12-05 DIAGNOSIS — A09 Infectious gastroenteritis and colitis, unspecified: Secondary | ICD-10-CM | POA: Insufficient documentation

## 2021-12-05 DIAGNOSIS — R109 Unspecified abdominal pain: Secondary | ICD-10-CM | POA: Diagnosis not present

## 2021-12-05 DIAGNOSIS — I4891 Unspecified atrial fibrillation: Secondary | ICD-10-CM | POA: Diagnosis not present

## 2021-12-05 DIAGNOSIS — R519 Headache, unspecified: Secondary | ICD-10-CM | POA: Insufficient documentation

## 2021-12-05 LAB — C DIFFICILE QUICK SCREEN W PCR REFLEX
C Diff antigen: NEGATIVE
C Diff interpretation: NOT DETECTED
C Diff toxin: NEGATIVE

## 2021-12-05 LAB — HEPATIC FUNCTION PANEL
ALT: 23 U/L (ref 0–44)
AST: 33 U/L (ref 15–41)
Albumin: 3.7 g/dL (ref 3.5–5.0)
Alkaline Phosphatase: 52 U/L (ref 38–126)
Bilirubin, Direct: 0.1 mg/dL (ref 0.0–0.2)
Indirect Bilirubin: 0.6 mg/dL (ref 0.3–0.9)
Total Bilirubin: 0.7 mg/dL (ref 0.3–1.2)
Total Protein: 6.5 g/dL (ref 6.5–8.1)

## 2021-12-05 LAB — BASIC METABOLIC PANEL WITH GFR
Anion gap: 6 (ref 5–15)
BUN: 17 mg/dL (ref 8–23)
CO2: 25 mmol/L (ref 22–32)
Calcium: 9.2 mg/dL (ref 8.9–10.3)
Chloride: 111 mmol/L (ref 98–111)
Creatinine, Ser: 0.8 mg/dL (ref 0.44–1.00)
GFR, Estimated: >60 mL/min
Glucose, Bld: 103 mg/dL — ABNORMAL HIGH (ref 70–99)
Potassium: 4.3 mmol/L (ref 3.5–5.1)
Sodium: 142 mmol/L (ref 135–145)

## 2021-12-05 LAB — GASTROINTESTINAL PANEL BY PCR, STOOL (REPLACES STOOL CULTURE)

## 2021-12-05 LAB — URINALYSIS, ROUTINE W REFLEX MICROSCOPIC
Bacteria, UA: NONE SEEN
Bilirubin Urine: NEGATIVE
Glucose, UA: NEGATIVE mg/dL
Hgb urine dipstick: NEGATIVE
Nitrite: NEGATIVE
Protein, ur: NEGATIVE mg/dL
Specific Gravity, Urine: <1.005 — ABNORMAL LOW (ref 1.005–1.030)
pH: 5.5 (ref 5.0–8.0)

## 2021-12-05 LAB — CBC
HCT: 46.3 % — ABNORMAL HIGH (ref 36.0–46.0)
Hemoglobin: 14.7 g/dL (ref 12.0–15.0)
MCH: 30.9 pg (ref 26.0–34.0)
MCHC: 31.7 g/dL (ref 30.0–36.0)
MCV: 97.3 fL (ref 80.0–100.0)
Platelets: 285 10*3/uL (ref 150–400)
RBC: 4.76 MIL/uL (ref 3.87–5.11)
RDW: 13.1 % (ref 11.5–15.5)
WBC: 5.5 10*3/uL (ref 4.0–10.5)
nRBC: 0 % (ref 0.0–0.2)

## 2021-12-05 LAB — MAGNESIUM: Magnesium: 2 mg/dL (ref 1.7–2.4)

## 2021-12-05 LAB — T4, FREE: Free T4: 1.11 ng/dL (ref 0.61–1.12)

## 2021-12-05 LAB — TSH: TSH: 2.114 u[IU]/mL (ref 0.350–4.500)

## 2021-12-05 MED ORDER — LACTATED RINGERS IV BOLUS
1000.0000 mL | Freq: Once | INTRAVENOUS | Status: AC
  Administered 2021-12-05: 1000 mL via INTRAVENOUS

## 2021-12-05 MED ORDER — ONDANSETRON 4 MG PO TBDP: 4.0000 mg | ORAL_TABLET | Freq: Three times a day (TID) | ORAL | 0 refills | Status: AC | PRN

## 2021-12-05 MED ORDER — IOHEXOL 350 MG/ML SOLN
75.0000 mL | Freq: Once | INTRAVENOUS | Status: AC | PRN
  Administered 2021-12-05: 75 mL via INTRAVENOUS

## 2021-12-05 MED ORDER — SODIUM CHLORIDE 0.9 % IV BOLUS
1000.0000 mL | Freq: Once | INTRAVENOUS | Status: AC
  Administered 2021-12-05: 1000 mL via INTRAVENOUS

## 2021-12-05 NOTE — Discharge Instructions (Addendum)
°  You went into Afib but went back into normal sinus. Call cardiology to set up follow up to discuss blood thinners but we are holding for now while you are sick.  If you have return of diarrhea that is severe call your PCP to take sample there.  Otherwise stay hydrated with pediatlyte or gatorade without sugar.  Return to Er for worsening symptoms or any other concerns.    IMPRESSION: 1. No acute intracranial abnormality. No intracranial mass, hemorrhage or edema. No skull fracture. 2. No fracture or acute subluxation within the cervical spine. Straightening of the normal cervical spine lordosis is likely related to patient positioning or muscle spasm. 3. Carotid atherosclerosis.

## 2021-12-05 NOTE — ED Triage Notes (Signed)
Pt via POV from home. Pt c/o diarrhea since Tuesday morning. Pt states that she had a syncopal episode on Tuesday. Pt states she fell and hit the back of her head since then she had episodes of lightheadedness. Pt states that today she still having diarrhea. Denies NV. Denies blood thinners Pt is A&Ox4 and NAD

## 2021-12-05 NOTE — ED Provider Notes (Signed)
[facility] Provider Note    Event Date/Time   First MD Initiated Contact with Patient 12/05/21 1202     (approximate)   History   Diarrhea   HPI  Gina Mcbride is a 76 y.o. female with COPD, hyperlipidemia, PACs, PVCs followed by cardiology currently on metoprolol who reports diarrhea last Tuesday and with nausea. She had syncope episode on Tuesday.  Patient reports that she was on the toilet having bowel movements when she passed out falling back and hitting her head and neck.  She reports some continued discomfort in these areas.  She also reports some pain in her mid abdomen.  She reports trying to eat crackers and drink fluid and stuff had stopped but now the diarrhea and started back up this morning.  Reports some intermittent dysuria as well. No chest pain.    Physical Exam   Triage Vital Signs: ED Triage Vitals  Enc Vitals Group     BP 12/05/21 1142 (!) 81/51     Pulse Rate 12/05/21 1142 (!) 142     Resp 12/05/21 1142 16     Temp 12/05/21 1142 97.6 F (36.4 C)     Temp Source 12/05/21 1142 Oral     SpO2 12/05/21 1142 98 %     Weight 12/05/21 1140 116 lb (52.6 kg)     Height 12/05/21 1140 5\' 4"  (1.626 m)     Head Circumference --      Peak Flow --      Pain Score 12/05/21 1140 0     Pain Loc --      Pain Edu? --      Excl. in [place]? --     Most recent vital signs: Vitals:   12/05/21 1142  BP: (!) 81/51  Pulse: (!) 142  Resp: 16  Temp: 97.6 F (36.4 C)  SpO2: 98%     General: Awake, no distress.  CV:  Good peripheral perfusion. Irregular tachy  Resp:  Normal effort.  Abd:  No distention.tender upper abdomen Other:  No leg swelling    ED Results / Procedures / Treatments   Labs (all labs ordered are listed, but only abnormal results are displayed) Labs Reviewed  BASIC METABOLIC PANEL - Abnormal; Notable for the following components:      Result Value   Glucose, Bld 103 (*)    All other components within normal limits   CBC - Abnormal; Notable for the following components:   HCT 46.3 (*)    All other components within normal limits  URINALYSIS, ROUTINE W REFLEX MICROSCOPIC  MAGNESIUM  HEPATIC FUNCTION PANEL  CBG MONITORING, ED     EKG  My interpretation of EKG:  Afib rate of 142, no st elevation, no twi, normal intervals   RADIOLOGY I have reviewed the xray personally and agree with radiology read nodule that needs CT   CT reviewed pending read    PROCEDURES:  Critical Care performed: No  .1-3 Lead EKG Interpretation Performed by: Vanessa [place], MD Authorized by: Vanessa [place], MD     Interpretation: abnormal     ECG rate:  100s   ECG rate assessment: tachycardic     Rhythm: atrial fibrillation     Ectopy: none     Conduction: normal   Comments:     Initially atrial fibrillation with a converted to normal sinus   MEDICATIONS ORDERED IN ED: Medications  sodium chloride 0.9 % bolus 1,000 mL (0 mLs Intravenous  Stopped 12/05/21 1310)  iohexol (OMNIPAQUE) 350 MG/ML injection 75 mL (75 mLs Intravenous Contrast Given 12/05/21 1403)     IMPRESSION / MDM / ASSESSMENT AND PLAN / ED COURSE  I reviewed the triage vital signs and the nursing notes.                              Differential diagnosis includes, but is not limited to, dehydration, patient looks like she has new A-fib on the monitor appear given she does report falling and hitting her head I ordered a CT head, CT cervical.  She is also tender in her abdomen so get CT abdomen.  Her x-ray was concerning for nodule therefore add on a CT PE given new pulmonary embolism.  I suspect that her tachycardia and low blood pressures is from dehydration so we will start some IV fluids.  She denies any chest pain do not believe this represents a heart attack.  I suspect that the cardiac markers may be slightly elevated due to demand from the A-fib with RVR from the dehydration from her diarrhea.    UA without evidence of UTI Thyroid is  normal Mag is normal BMP is normal CBC is normal  CT imaging is negative.  Reevaluated patient heart rates have come down and patient appears to be normal sinus blood pressures are normalized.  Patient been unable to give a stool sample but CT imaging was all reassuring.  Patient is already on metoprolol.  When I can tell she does not have a history of A-fib.  We discussed blood thinner but at this time I do not want to start her on it given she has been ill indicates she has recurrent weakness and falls.  She can follow-up with cardiology and discuss starting a blood thinner.  She expressed understanding with this plan.  Do not want to start her on antibiotics at this time given patient is afebrile, normal white count, no blood in the stool and concern that this could make things worse.  She denies any recent antibiotics to suggest C. difficile you into a new rhythm called atrial fibrillation but you went back out of it  Patient had oncoming team pending p.o. challenge, ambulation trial but given reassuring work-up and patient is back in normal sinus patient could be discharged home if feeling better.  You went into a new rhythm called atrial fibrillation but you are back into normal sinus at this time.  You need to call your cardiologist to get follow-up in the next week to discuss being placed on a blood thinner for further management of this.  I suspect the diarrhea is most likely viral in nature.   The patient is on the cardiac monitor to evaluate for evidence of arrhythmia and/or significant heart rate changes.   FINAL CLINICAL IMPRESSION(S) / ED DIAGNOSES   Final diagnoses:  Diarrhea of infectious origin  New onset a-fib ([place])     Rx / DC Orders   ED Discharge Orders          Ordered    ondansetron (ZOFRAN-ODT) 4 MG disintegrating tablet  Every 8 hours PRN        12/05/21 1524             Note:  This document was prepared using Dragon voice recognition software and may  include unintentional dictation errors.   Vanessa [place], MD 12/05/21 1524

## 2021-12-05 NOTE — ED Notes (Signed)
Dr. Smith at  bedside talking to pt

## 2021-12-05 NOTE — ED Notes (Signed)
Ambulated pt in the room, to the toilet, denies any complaints, no dizziness.

## 2021-12-05 NOTE — ED Notes (Signed)
Ok to [employer] per Dr. Tamala Julian.

## 2021-12-11 ENCOUNTER — Emergency Department
Admission: EM | Admit: 2021-12-11 | Discharge: 2021-12-11 | Disposition: A | Discharge status: Home / Self Care | Payer: Medicare Other | Attending: Emergency Medicine | Admitting: Emergency Medicine

## 2021-12-11 ENCOUNTER — Other Ambulatory Visit: Payer: Self-pay

## 2021-12-11 DIAGNOSIS — Z87891 Personal history of nicotine dependence: Secondary | ICD-10-CM | POA: Diagnosis not present

## 2021-12-11 DIAGNOSIS — J45909 Unspecified asthma, uncomplicated: Secondary | ICD-10-CM | POA: Insufficient documentation

## 2021-12-11 DIAGNOSIS — J449 Chronic obstructive pulmonary disease, unspecified: Secondary | ICD-10-CM | POA: Insufficient documentation

## 2021-12-11 DIAGNOSIS — R42 Dizziness and giddiness: Secondary | ICD-10-CM | POA: Insufficient documentation

## 2021-12-11 DIAGNOSIS — I491 Atrial premature depolarization: Secondary | ICD-10-CM | POA: Diagnosis not present

## 2021-12-11 DIAGNOSIS — Z79899 Other long term (current) drug therapy: Secondary | ICD-10-CM | POA: Insufficient documentation

## 2021-12-11 DIAGNOSIS — R55 Syncope and collapse: Secondary | ICD-10-CM | POA: Diagnosis present

## 2021-12-11 HISTORY — DX: Unspecified atrial fibrillation: I48.91

## 2021-12-11 LAB — CBC WITH DIFFERENTIAL/PLATELET
Abs Immature Granulocytes: 0.01 10*3/uL (ref 0.00–0.07)
Basophils Absolute: 0.1 10*3/uL (ref 0.0–0.1)
Basophils Relative: 1 %
Eosinophils Absolute: 0 10*3/uL (ref 0.0–0.5)
Eosinophils Relative: 1 %
HCT: 39.5 % (ref 36.0–46.0)
Hemoglobin: 12.7 g/dL (ref 12.0–15.0)
Immature Granulocytes: 0 %
Lymphocytes Relative: 27 %
Lymphs Abs: 1.7 10*3/uL (ref 0.7–4.0)
MCH: 31.2 pg (ref 26.0–34.0)
MCHC: 32.2 g/dL (ref 30.0–36.0)
MCV: 97.1 fL (ref 80.0–100.0)
Monocytes Absolute: 0.5 10*3/uL (ref 0.1–1.0)
Monocytes Relative: 8 %
Neutro Abs: 4 10*3/uL (ref 1.7–7.7)
Neutrophils Relative %: 63 %
Platelets: 241 10*3/uL (ref 150–400)
RBC: 4.07 MIL/uL (ref 3.87–5.11)
RDW: 12.9 % (ref 11.5–15.5)
WBC: 6.3 10*3/uL (ref 4.0–10.5)
nRBC: 0 % (ref 0.0–0.2)

## 2021-12-11 LAB — COMPREHENSIVE METABOLIC PANEL
ALT: 16 U/L (ref 0–44)
AST: 25 U/L (ref 15–41)
Albumin: 3.7 g/dL (ref 3.5–5.0)
Alkaline Phosphatase: 44 U/L (ref 38–126)
Anion gap: 9 (ref 5–15)
BUN: 18 mg/dL (ref 8–23)
CO2: 25 mmol/L (ref 22–32)
Calcium: 9.2 mg/dL (ref 8.9–10.3)
Chloride: 108 mmol/L (ref 98–111)
Creatinine, Ser: 0.81 mg/dL (ref 0.44–1.00)
GFR, Estimated: >60 mL/min (ref >60)
Glucose, Bld: 86 mg/dL (ref 70–99)
Potassium: 4.2 mmol/L (ref 3.5–5.1)
Sodium: 142 mmol/L (ref 135–145)
Total Bilirubin: 0.8 mg/dL (ref 0.3–1.2)
Total Protein: 6.2 g/dL — ABNORMAL LOW (ref 6.5–8.1)

## 2021-12-11 LAB — TROPONIN I (HIGH SENSITIVITY): Troponin I (High Sensitivity): 8 ng/L (ref <18)

## 2021-12-11 MED ORDER — SODIUM CHLORIDE 0.9 % IV BOLUS
1000.0000 mL | Freq: Once | INTRAVENOUS | Status: AC
  Administered 2021-12-11: 1000 mL via INTRAVENOUS

## 2021-12-11 NOTE — Discharge Instructions (Addendum)
Your blood work was reassuring today.  On the monitor and you did have some premature atrial contractions and premature ventricular contractions which has been seen before when you wore a Zio patch.  Please follow-up with Dr. Corky Sox with Cardiology.  Please make sure you are standing up slowly and that if you start to feel lightheaded to sit down.  Avoid driving if you are at all feeling like he could pass out.  Please try to stay hydrated.

## 2021-12-11 NOTE — ED Triage Notes (Signed)
Pt arrived via EMS. Pt was at church and had a syncopal episode. Witnessed fall pt did strike her head. Pt was seen last week from same and dx with a-fib. Pt is not currently anticoagulated. A/Ox4 on arrival, NAD.

## 2021-12-11 NOTE — ED Provider Notes (Signed)
[facility] Provider Note    Event Date/Time   First MD Initiated Contact with Patient 12/11/21 1323     (approximate)   History   Loss of Consciousness   HPI  Gina Mcbride is a 76 y.o. female with past medical history of atrial fibrillation, palpitations, syncope, COPD.  Raynaud's disease who presents after an episode of syncope.  Patient notes that this morning while driving to church she felt lightheaded.  She was then standing at church and continued to feel like she may pass out.  She then sat down and was noted to have a syncopal episode.  Her husband said that while she was in the chair she did slouched over and hit her head on the neighboring chair but did not fall to the ground.  No seizure-like activity.  Patient denies any chest pain palpitations or shortness of breath.  She was seen about a week ago for similar episode of syncope that occurred while she was having bowel movements.  She has since been seen by Dr. Kerri Perches with cardiology and is currently on a Zio patch.  Patient is getting over a recent illness, had several bouts of diarrhea last week but this is now resolved and she denies abdominal pain but is still not eating as much as normally.  Denies fevers chills or urinary symptoms.  Denies headache.   Per note from Dr. Ned Grace, patient has history of chronic lightheadedness tachycardia and shortness of breath and has worn a Zio patch before that showed frequent PVCs and PACs.  He describes a similar episode today where she had a syncopal episode while at church.  She is currently on metoprolol XL 50 mg daily for the PVCs.  Past Medical History:  Diagnosis Date   A-fib ([place])    Asthma    Barlow's syndrome    COPD (chronic obstructive pulmonary disease) (HCC)    hx - no issues now   Dysrhythmia    rare - unnamed - followed by PCP, palpatations   Family history of adverse reaction to anesthesia    Father - severe swelling after open heart  surgery   Osteoporosis    Palpitations    Raynaud's disease without gangrene    Right tennis elbow    Thrombocytosis    Vertigo    yrs ago - no issues now   Vitamin D deficiency    Wears dentures    full upper, partial lower    Patient Active Problem List   Diagnosis Date Noted   Senile osteoporosis 10/18/2019   Lumbar spondylosis 09/18/2019   Carotid stenosis 03/19/2019   Amaurosis fugax 03/19/2019   Chest pain 02/15/2018   Lightheaded 02/15/2018   Palpitations 02/15/2018   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   Mixed hyperlipidemia 02/15/2018   History of smoking 02/15/2018   Anemia 02/15/2018   Raynaud's disease without gangrene 02/15/2018   Shortness of breath 02/15/2018   Anxiety 02/15/2018   Osteoporosis 11/30/2017   Thrombocytosis 11/30/2017     Physical Exam  Triage Vital Signs: ED Triage Vitals  Enc Vitals Group     BP 12/11/21 1320 105/90     Pulse Rate 12/11/21 1320 87     Resp 12/11/21 1320 14     Temp 12/11/21 1320 (!) 97.5 F (36.4 C)     Temp Source 12/11/21 1320 Oral     SpO2 12/11/21 1320 100 %     Weight 12/11/21 1322 115 lb (52.2 kg)  Height 12/11/21 1322 5\' 4"  (1.626 m)     Head Circumference --      Peak Flow --      Pain Score 12/11/21 1322 0     Pain Loc --      Pain Edu? --      Excl. in [place]? --     Most recent vital signs: Vitals:   12/11/21 1500 12/11/21 1530  BP: 118/78 114/66  Pulse: 67 70  Resp: 20 16  Temp:    SpO2: 98% 97%     General: Awake, no distress.  CV:  Good peripheral perfusion.  No lower extremity edema Resp:  Normal effort.  Abd:  No distention.  Soft and nontender throughout Neuro:             Awake, Alert, Oriented x 3  Other:     ED Results / Procedures / Treatments  Labs (all labs ordered are listed, but only abnormal results are displayed) Labs Reviewed  COMPREHENSIVE METABOLIC PANEL - Abnormal; Notable for the following components:      Result Value   Total Protein 6.2 (*)    All other  components within normal limits  CBC WITH DIFFERENTIAL/PLATELET  TROPONIN I (HIGH SENSITIVITY)     EKG  EKG interpreted by myself, right axis deviation, normal sinus rhythm with frequent PACs, no acute ischemic changes   RADIOLOGY    PROCEDURES:  Critical Care performed: No  .1-3 Lead EKG Interpretation Performed by: Rada Hay, MD Authorized by: Rada Hay, MD     Interpretation: normal     ECG rate assessment: normal     Rhythm: sinus rhythm     Ectopy: PAC     Conduction: normal    The patient is on the cardiac monitor to evaluate for evidence of arrhythmia and/or significant heart rate changes.   MEDICATIONS ORDERED IN ED: Medications  sodium chloride 0.9 % bolus 1,000 mL (0 mLs Intravenous Stopped 12/11/21 1514)     IMPRESSION / MDM / ASSESSMENT AND PLAN / ED COURSE  I reviewed the triage vital signs and the nursing notes.                              Differential diagnosis includes, but is not limited to, vasovagal syncope, orthostatic syncope, arrhythmia  Patient is a 76 year old female with prior episodes of syncope and frequent lightheadedness who is being followed by cardiology and is currently wearing a Zio patch who presents after syncopal episode.  Patient felt somewhat lightheaded on her way over to church and then with standing continue to feel lightheaded like she was going to pass out.  She then sat down and had a syncopal episode.  Patient did hit her head but only on a neighboring chair did not fall to the ground or have significant head strike.  She denies any palpitations chest pain or shortness of breath.  She was seen in the ED last week for an episode of syncope in the setting of having diarrhea.  Patient has since followed up with Dr. Corky Sox of cardiology and has a Zio patch currently.  On EKG today she is in normal sinus rhythm with PACs with a normal heart rate no ischemic changes.  Her CBC CMP and troponin are all reassuring, no  electrolyte abnormalities leukocytosis anemia and her troponin is negative.  I reassured with her prodromal symptoms occurring while standing at church this was likely  a vasovagal episode.  Discussed with Dr. Saralyn Pilar with cardiology to see if we would be able to get a readout from the Zio patch during the episode today unfortunately this is not possible.  Patient did have some ectopy on the monitor but has had frequent PACs and PVCs in the past.  She is on metoprolol for this.  Overall I think she is appropriate for outpatient management.  She is already set up to have good cardiology follow-up and is on a cardiac monitor.  I feel that the risk of malignant arrhythmia is low do not think that admission for telemetry would provide much additional benefit.  Clinical Course as of 12/11/21 1555  Sat Dec 11, 2021  1511 Troponin I (High Sensitivity): 8 [KM]    Clinical Course User Index [KM] Rada Hay, MD     FINAL CLINICAL IMPRESSION(S) / ED DIAGNOSES   Final diagnoses:  Syncope, unspecified syncope type     Rx / DC Orders   ED Discharge Orders     None        Note:  This document was prepared using Dragon voice recognition software and may include unintentional dictation errors.   Rada Hay, MD 12/11/21 [phone]

## 2021-12-11 NOTE — ED Notes (Signed)
Pt noted to have runs of V-tach on the monitor. Pt reporting feelings of palpitations. Pt denies CP or ShOB. Pt placed on defib pads and this RN will continue to monitor. EDP notified.

## 2021-12-29 ENCOUNTER — Ambulatory Visit: Payer: Medicare Other | Admitting: Medical

## 2022-02-09 ENCOUNTER — Other Ambulatory Visit: Payer: Self-pay | Admitting: Nurse Practitioner

## 2022-02-09 DIAGNOSIS — Z1231 Encounter for screening mammogram for malignant neoplasm of breast: Secondary | ICD-10-CM

## 2022-03-01 ENCOUNTER — Ambulatory Visit
Admission: RE | Admit: 2022-03-01 | Discharge: 2022-03-01 | Disposition: A | Discharge status: Home / Self Care | Payer: Medicare Other | Source: Ambulatory Visit | Attending: Nurse Practitioner | Admitting: Nurse Practitioner

## 2022-03-01 DIAGNOSIS — Z1231 Encounter for screening mammogram for malignant neoplasm of breast: Secondary | ICD-10-CM | POA: Insufficient documentation

## 2022-04-04 ENCOUNTER — Other Ambulatory Visit: Payer: Self-pay | Admitting: Cardiovascular Disease

## 2022-04-04 NOTE — Telephone Encounter (Signed)
Please schedule overdue F/U appointment for refills. Thank you! 

## 2022-04-04 NOTE — Telephone Encounter (Signed)
Changed cardiologists declined fu

## 2022-04-18 ENCOUNTER — Other Ambulatory Visit: Payer: Self-pay | Admitting: Cardiovascular Disease

## 2022-09-16 ENCOUNTER — Ambulatory Visit (INDEPENDENT_AMBULATORY_CARE_PROVIDER_SITE_OTHER): Payer: Medicare Other

## 2022-09-16 ENCOUNTER — Encounter (INDEPENDENT_AMBULATORY_CARE_PROVIDER_SITE_OTHER): Payer: Self-pay | Admitting: Vascular Surgery

## 2022-09-16 ENCOUNTER — Ambulatory Visit (INDEPENDENT_AMBULATORY_CARE_PROVIDER_SITE_OTHER): Payer: Medicare Other | Admitting: Vascular Surgery

## 2022-09-16 VITALS — BP 112/74 | HR 86 | Resp 15 | Wt 119.2 lb

## 2022-09-16 DIAGNOSIS — I6523 Occlusion and stenosis of bilateral carotid arteries: Secondary | ICD-10-CM

## 2022-09-16 DIAGNOSIS — I7300 Raynaud's syndrome without gangrene: Secondary | ICD-10-CM | POA: Diagnosis not present

## 2022-09-16 DIAGNOSIS — E782 Mixed hyperlipidemia: Secondary | ICD-10-CM | POA: Diagnosis not present

## 2022-09-16 NOTE — Progress Notes (Signed)
MRN : [id number]  Gina Mcbride is a 76 y.o. (1946/09/27) female who presents with chief complaint of  Chief Complaint  Patient presents with   Follow-up    Ultrasound follow up  .  History of Present Illness: Patient returns today in follow up of her carotid disease.  She is doing well.  She has no focal neurologic symptoms. Specifically, the patient denies amaurosis fugax, speech or swallowing difficulties, or arm or leg weakness or numbness.  Her carotid duplex today reveals stable 1 to 39% ICA stenosis bilaterally  Current Outpatient Medications  Medication Sig Dispense Refill   Calcium Citrate-Vitamin D (CALCIUM + D PO) Take by mouth. lunchtime     celecoxib (CELEBREX) 200 MG capsule Take by mouth.     Cholecalciferol (VITAMIN D PO) Take by mouth. lunchtime     clonazePAM (KLONOPIN) 1 MG tablet Take 1 mg by mouth at bedtime.     metoprolol succinate (TOPROL-XL) 50 MG 24 hr tablet Take 1 tablet (50 mg total) by mouth daily. Take with or immediately following a meal. 90 tablet 3   rosuvastatin (CRESTOR) 5 MG tablet Take 1 tablet (5 mg total) by mouth daily. 90 tablet 3   denosumab (PROLIA) 60 MG/ML SOLN injection Inject 60 mg into the skin every 6 (six) months. Administer in upper arm, thigh, or abdomen (Patient not taking: Reported on 12/05/2021)     etodolac (LODINE) 400 MG tablet Take 400 mg by mouth daily.  (Patient not taking: Reported on 12/05/2021)     hydrOXYzine (VISTARIL) 25 MG capsule Take 25 mg by mouth 2 (two) times daily. (Patient not taking: Reported on 12/05/2021)     prednisoLONE acetate (PRED FORTE) 1 % ophthalmic suspension Place 1 drop into the right eye daily.     No current facility-administered medications for this visit.    Past Medical History:  Diagnosis Date   A-fib ([place])    Asthma    Barlow's syndrome    COPD (chronic obstructive pulmonary disease) (HCC)    hx - no issues now   Dysrhythmia    rare - unnamed - followed by PCP, palpatations   Family  history of adverse reaction to anesthesia    Father - severe swelling after open heart surgery   Osteoporosis    Palpitations    Raynaud's disease without gangrene    Right tennis elbow    Thrombocytosis    Vertigo    yrs ago - no issues now   Vitamin D deficiency    Wears dentures    full upper, partial lower    Past Surgical History:  Procedure Laterality Date   APPENDECTOMY     BLEPHAROPLASTY     CATARACT EXTRACTION W/PHACO Right 07/01/2015   Procedure: CATARACT EXTRACTION PHACO AND INTRAOCULAR LENS PLACEMENT ([place]);  Surgeon: Leandrew Koyanagi, MD;  Location: [place];  Service: Ophthalmology;  Laterality: Right;   CATARACT EXTRACTION W/PHACO Left 11/15/2017   Procedure: CATARACT EXTRACTION PHACO AND INTRAOCULAR LENS PLACEMENT ([place]) LEFT;  Surgeon: Leandrew Koyanagi, MD;  Location: [place];  Service: Ophthalmology;  Laterality: Left;   COLONOSCOPY     COLONOSCOPY WITH PROPOFOL N/A 06/27/2018   Procedure: COLONOSCOPY WITH PROPOFOL;  Surgeon: Toledo, Benay Pike, MD;  Location: [facility];  Service: Gastroenterology;  Laterality: N/A;   ESOPHAGOGASTRODUODENOSCOPY (EGD) WITH PROPOFOL N/A 06/27/2018   Procedure: ESOPHAGOGASTRODUODENOSCOPY (EGD) WITH PROPOFOL;  Surgeon: Toledo, Benay Pike, MD;  Location: [facility];  Service: Gastroenterology;  Laterality: N/A;  TONSILLECTOMY     TUBAL LIGATION       Social History   Tobacco Use   Smoking status: Former    Packs/day: 1.00    Years: 25.00    Total pack years: 25.00    Types: Cigarettes    Quit date: 10/31/1994    Years since quitting: 27.8   Smokeless tobacco: Never  Vaping Use   Vaping Use: Never used  Substance Use Topics   Alcohol use: No   Drug use: Never       Family History  Problem Relation Age of Onset   Breast cancer Sister 19     Allergies  Allergen Reactions   Erythromycin Base    Other Swelling    Hand contact with raw shrimp   Oxytetracycline Other (See Comments)     Mouth blisters (all -mycins) Mouth blisters (all -mycins)   Shrimp [Shellfish Allergy] Swelling    Hand contact with raw shrimp    REVIEW OF SYSTEMS (Negative unless checked)   Constitutional: '[]'$ Weight loss  '[]'$ Fever  '[]'$ Chills Cardiac: '[]'$ Chest pain   '[]'$ Chest pressure   '[]'$ Palpitations   '[]'$ Shortness of breath when laying flat   '[]'$ Shortness of breath at rest   '[]'$ Shortness of breath with exertion. Vascular:  '[]'$ Pain in legs with walking   '[]'$ Pain in legs at rest   '[]'$ Pain in legs when laying flat   '[]'$ Claudication   '[]'$ Pain in feet when walking  '[]'$ Pain in feet at rest  '[]'$ Pain in feet when laying flat   '[]'$ History of DVT   '[]'$ Phlebitis   '[]'$ Swelling in legs   '[]'$ Varicose veins   '[]'$ Non-healing ulcers Pulmonary:   '[]'$ Uses home oxygen   '[]'$ Productive cough   '[]'$ Hemoptysis   '[]'$ Wheeze  '[x]'$ COPD   '[]'$ Asthma Neurologic:  '[x]'$ Dizziness  '[]'$ Blackouts   '[]'$ Seizures   '[]'$ History of stroke   '[]'$ History of TIA  '[]'$ Aphasia   '[x]'$ Temporary blindness   '[]'$ Dysphagia   '[]'$ Weakness or numbness in arms   '[]'$ Weakness or numbness in legs Musculoskeletal:  '[x]'$ Arthritis   '[]'$ Joint swelling   '[x]'$ Joint pain   '[]'$ Low back pain Hematologic:  '[]'$ Easy bruising  '[]'$ Easy bleeding   '[]'$ Hypercoagulable state   '[x]'$ Anemic  '[]'$ Hepatitis Gastrointestinal:  '[]'$ Blood in stool   '[]'$ Vomiting blood  '[x]'$ Gastroesophageal reflux/heartburn   '[]'$ Abdominal pain Genitourinary:  '[]'$ Chronic kidney disease   '[]'$ Difficult urination  '[]'$ Frequent urination  '[]'$ Burning with urination   '[]'$ Hematuria Skin:  '[]'$ Rashes   '[]'$ Ulcers   '[]'$ Wounds Psychological:  '[]'$ History of anxiety   '[]'$  History of major depression.  Physical Examination  BP 112/74 (BP Location: Left Arm)   Pulse 86   Resp 15   Wt 119 lb 3.2 oz (54.1 kg)   BMI 20.46 kg/m  Gen:  WD/WN, NAD.  Appears younger than stated age Head: [place]/AT, No temporalis wasting. Ear/Nose/Throat: Hearing grossly intact, nares w/o erythema or drainage Eyes: Conjunctiva clear. Sclera non-icteric Neck: Supple.  Trachea midline Pulmonary:  Good air  movement, no use of accessory muscles.  Cardiac: RRR, no JVD Vascular:  Vessel Right Left  Radial Palpable Palpable           Musculoskeletal: M/S 5/5 throughout.  No deformity or atrophy.  No edema. Neurologic: Sensation grossly intact in extremities.  Symmetrical.  Speech is fluent.  Psychiatric: Judgment intact, Mood & affect appropriate for pt's clinical situation. Dermatologic: No rashes or ulcers noted.  No cellulitis or open wounds.      Labs No results found for this or any previous visit (from the past 2160 hour(s)).  Radiology No results found.  Assessment/Plan Mixed hyperlipidemia lipid control important in reducing the progression of atherosclerotic disease.    Raynaud's disease without gangrene Symptoms fairly mild at this point although more noticeable in the cold weather.   Carotid stenosis Her duplex today shows mild 1 to 39% carotid artery stenosis bilaterally without significant progression from her previous study. No change in medical regimen at this time other than an aspirin daily.  Follow-up on an annual basis for now.    Leotis Pain, MD  09/16/2022 12:06 PM    This note was created with Dragon medical transcription system.  Any errors from dictation are purely unintentional

## 2022-12-12 ENCOUNTER — Telehealth: Payer: Self-pay | Admitting: Cardiovascular Disease

## 2022-12-12 NOTE — Telephone Encounter (Signed)
Pt called to report since last week she's been experiencing elevated HR. She reports she didn't check readings last week but today HR was 116, 118, 114 (at rest). Pt denies feeling lightheaded or dizzy. She stated she just feel completely wiped out.  Pt last seen by Dr. Rockey Situ 03/30/21. Pt currently followed by [facility].  Pt stated she contact [facility] and was told Dr. Clayborn Bigness can see her in office tomorrow as her primary cardiologist retired. Nurse advised pt to schedule appointment with [facility] and contact us at a later date if she would like to switch back to [facility]  Pt verbalized understanding.

## 2022-12-12 NOTE — Telephone Encounter (Signed)
STAT if HR is under 50 or over 120 (normal HR is 60-100 beats per minute)  What is your heart rate? 114  Do you have a log of your heart rate readings (document readings)? 118, 114  Do you have any other symptoms? Hot flashes and then chills  Patient states her HR has been very high and she will not even be moving much. She says the last couple nights she has also gotten hot flashes and then chills.

## 2023-02-01 ENCOUNTER — Other Ambulatory Visit: Payer: Self-pay | Admitting: Nurse Practitioner

## 2023-02-01 DIAGNOSIS — Z1231 Encounter for screening mammogram for malignant neoplasm of breast: Secondary | ICD-10-CM

## 2023-02-06 IMAGING — CT CT CERVICAL SPINE W/O CM
3 of 4 series · 12 of 34 positions shown, 14 images · non-contrast
Comparison: None.

CLINICAL DATA: Neck trauma.



[Series 4: sagittal bone · sagittal · 0.39mm/px · 5 of 93 slices shown, 6 images]
[im 31/93  bone]
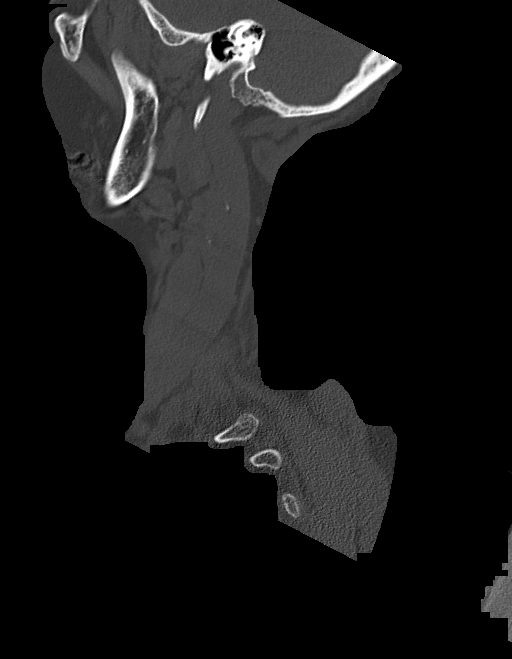
[im 39/93  bone]
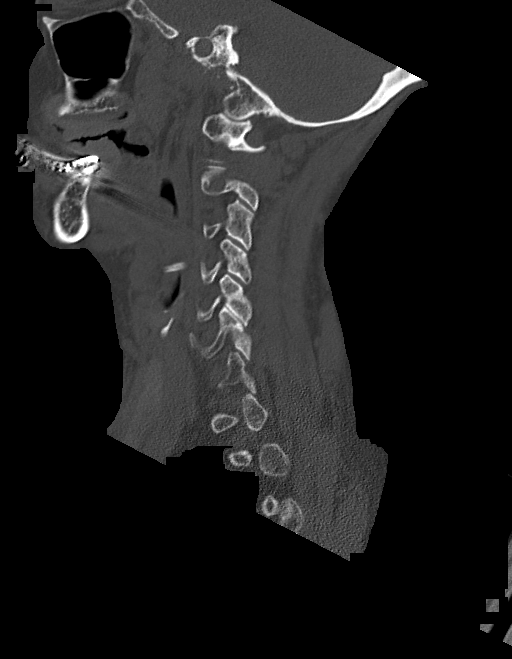
[im 47/93  soft-tissue]
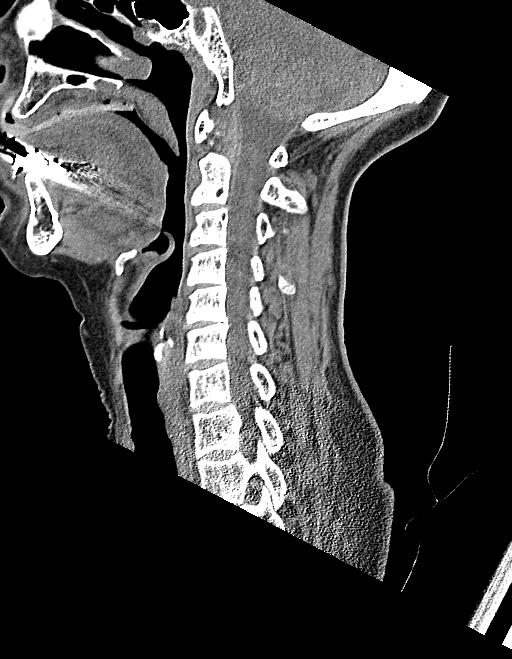
[im 47/93  bone]
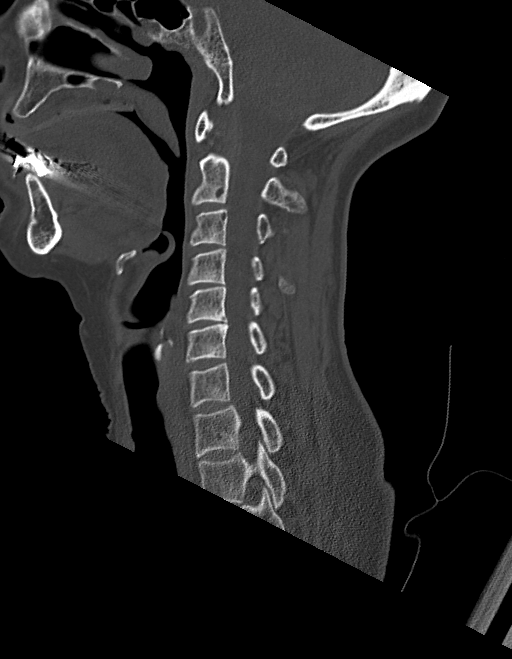
[im 54/93  bone]
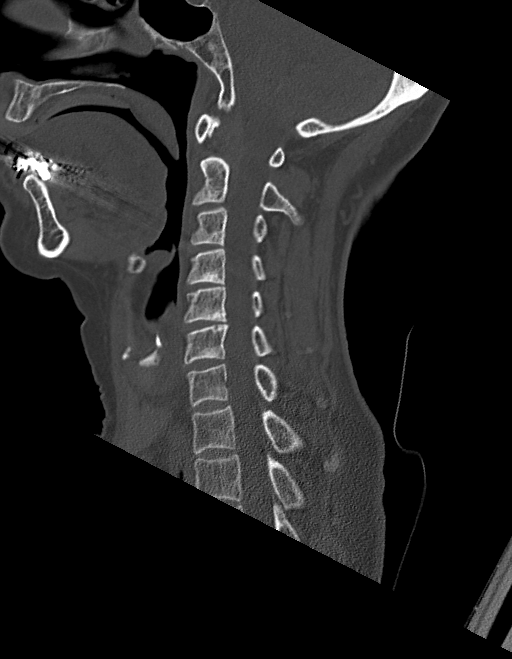
[im 62/93  bone]
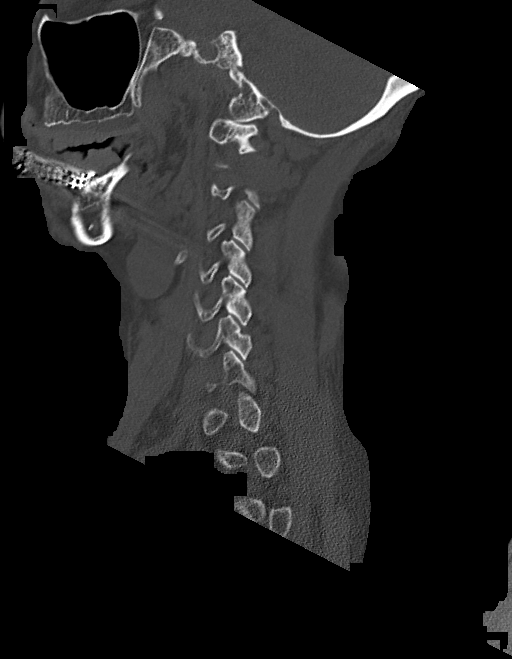

[Series 5: coronal bone · coronal · 0.48mm/px · 3 of 118 slices shown]
[im 24/118  bone]
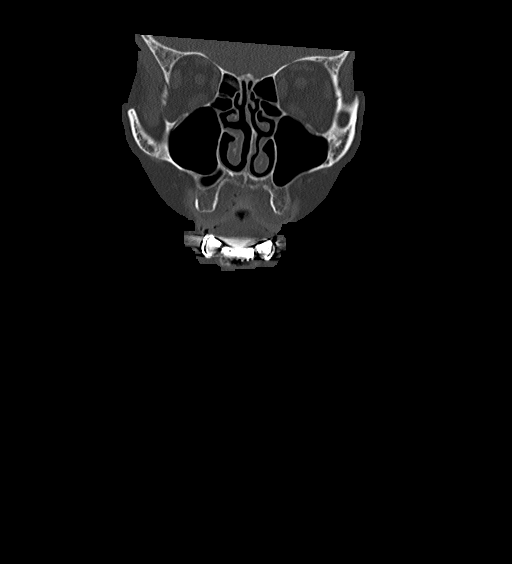
[im 47/118  bone]
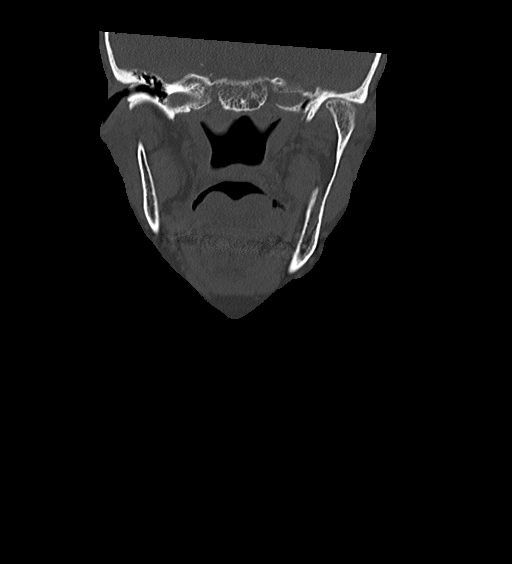
[im 71/118  bone]
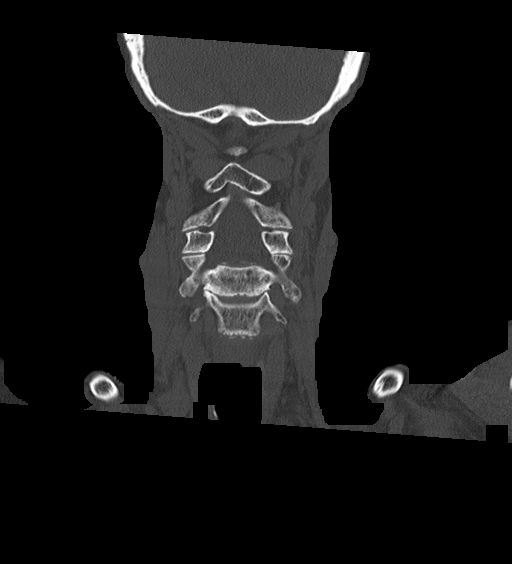

[Series 6: orthogonal bone · axial · 0.49mm/px · z∈[-363,-200]mm · 4 of 123 slices shown, 5 images]
[im 18/123  soft-tissue]
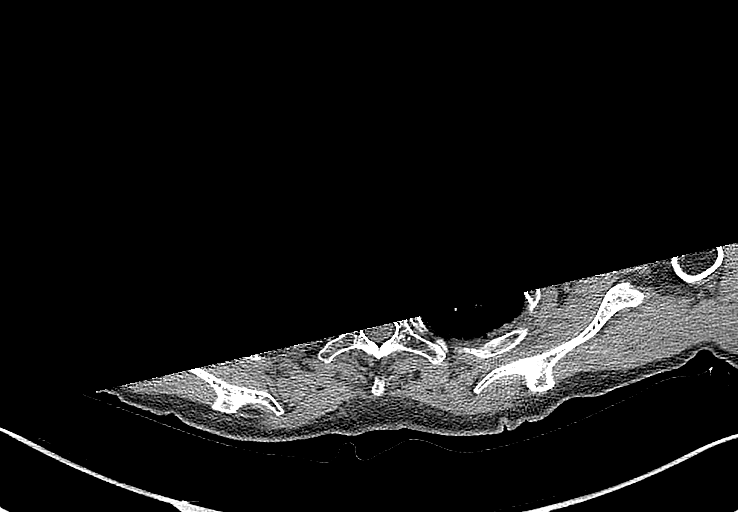
[im 18/123  bone]
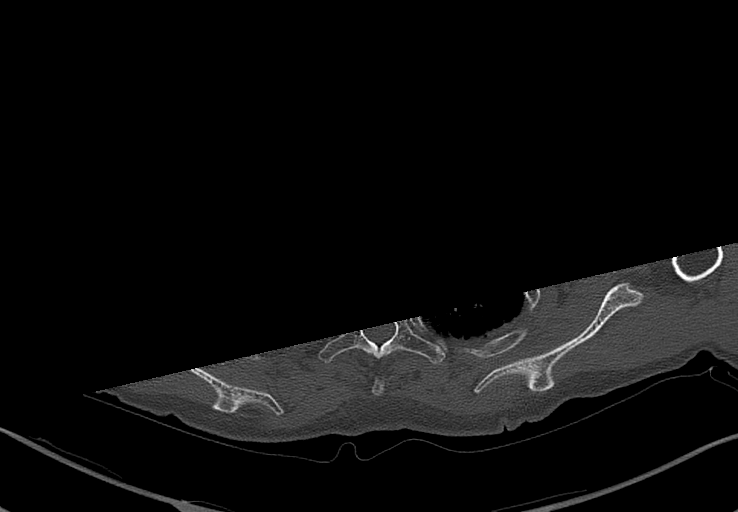
[im 53/123  bone]
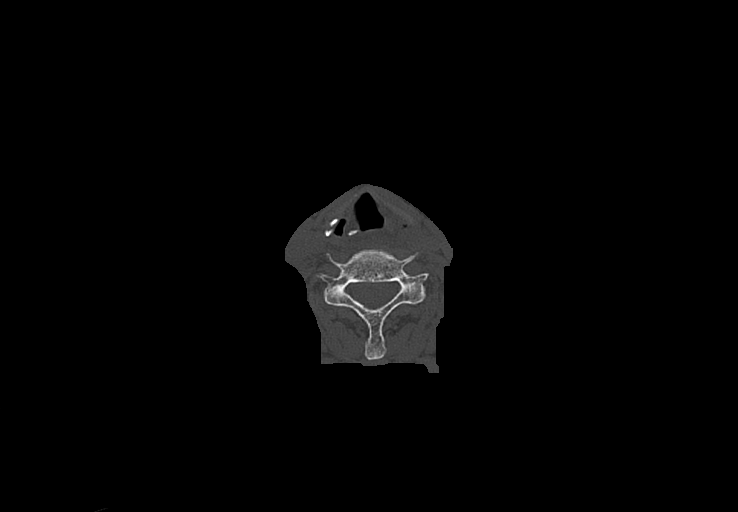
[im 70/123  bone]
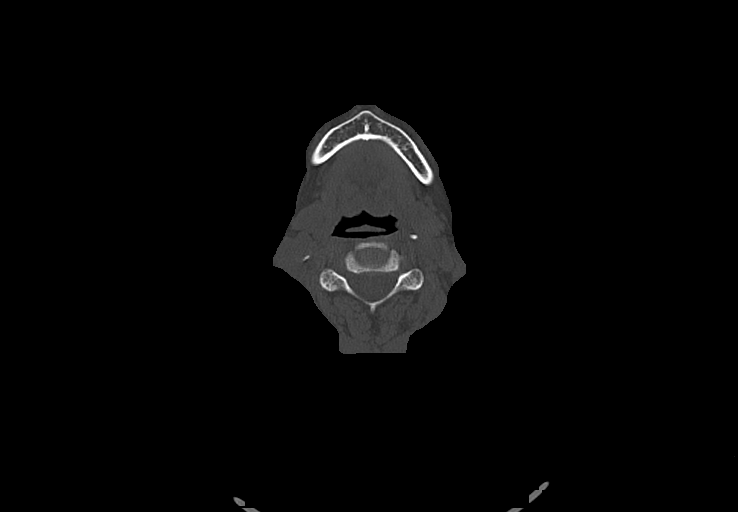
[im 105/123  bone]
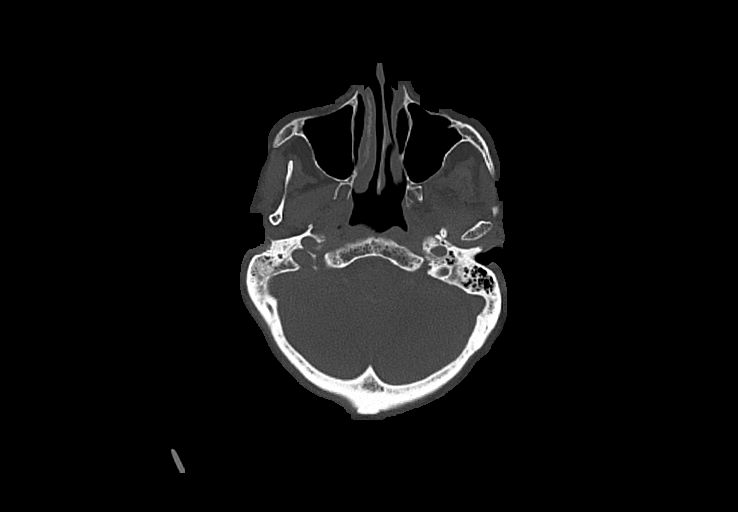

[12 of 34 positions shown; findings below may reference images not displayed]

FINDINGS: CT HEAD FINDINGS

Brain: Mild generalized parenchymal volume loss with commensurate
dilatation of the ventricles and sulci. No mass, hemorrhage, edema
or other evidence of acute parenchymal abnormality. No extra-axial
hemorrhage.

Vascular: Chronic calcified atherosclerotic changes of the large
vessels at the skull base. No unexpected hyperdense vessel.

Skull: Normal. Negative for fracture or focal lesion.

Sinuses/Orbits: No acute finding.

Other: None.

CT CERVICAL SPINE FINDINGS

Alignment: Straightening of the normal cervical spine lordosis. No
evidence of acute vertebral body subluxation.

Skull base and vertebrae: No fracture line or displaced fracture
fragment seen.

Soft tissues and spinal canal: No prevertebral fluid or swelling. No
visible canal hematoma.

Disc levels: Disc spaces are well maintained throughout. No
significant central canal stenosis at any level.

Upper chest: Pleuroparenchymal scarring/fibrosis at the bilateral
lung apices. No acute findings.

Other: Bilateral carotid atherosclerosis.
IMPRESSION: 1. No acute intracranial abnormality. No intracranial mass,
hemorrhage or edema. No skull fracture.
2. No fracture or acute subluxation within the cervical spine.
Straightening of the normal cervical spine lordosis is likely
related to patient positioning or muscle spasm.
3. Carotid atherosclerosis.

## 2023-02-06 IMAGING — DX DG CHEST 1V PORT
1 series · 1 of 1 positions shown · non-contrast
Comparison: 07/16/2012

CLINICAL DATA: Syncope and fall.

EXAM:
PORTABLE CHEST 1 VIEW

[chest ap]
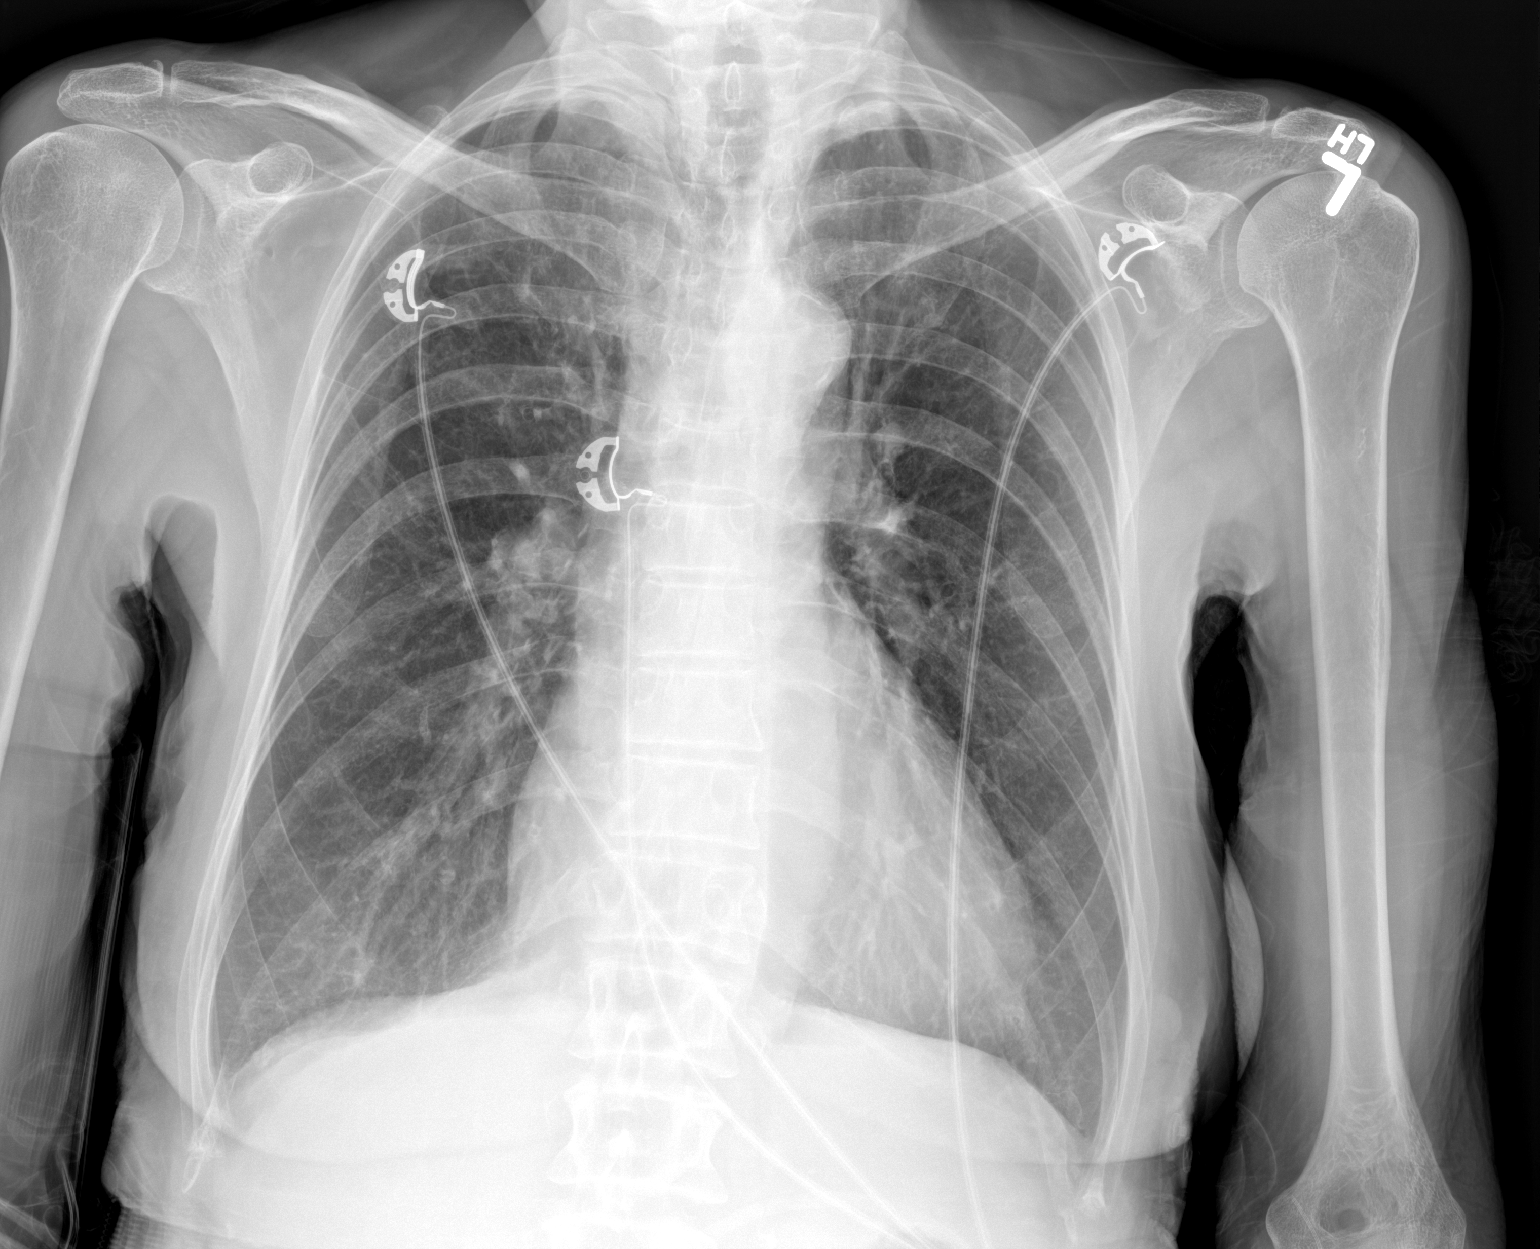

[1 of 1 positions shown; findings below may reference images not displayed]

FINDINGS: UPPER limits normal heart size again noted. The mediastinal
silhouette is unchanged.

A nodular opacity in the LEFT perihilar region appears new.

There is no evidence of focal airspace disease, pulmonary edema,
pleural effusion, or pneumothorax.

No acute bony abnormalities are identified.
IMPRESSION: Nodular opacity in the LEFT perihilar region. Chest CT with contrast
recommended for further evaluation.

## 2023-03-03 ENCOUNTER — Ambulatory Visit
Admission: RE | Admit: 2023-03-03 | Discharge: 2023-03-03 | Disposition: A | Discharge status: Home / Self Care | Payer: Medicare Other | Source: Ambulatory Visit | Attending: Nurse Practitioner | Admitting: Nurse Practitioner

## 2023-03-03 DIAGNOSIS — Z1231 Encounter for screening mammogram for malignant neoplasm of breast: Secondary | ICD-10-CM

## 2023-08-23 ENCOUNTER — Emergency Department (HOSPITAL_COMMUNITY): Payer: Medicare Other

## 2023-08-23 ENCOUNTER — Encounter (HOSPITAL_COMMUNITY): Payer: Self-pay | Admitting: *Deleted

## 2023-08-23 ENCOUNTER — Emergency Department (HOSPITAL_COMMUNITY)
Admission: EM | Admit: 2023-08-23 | Discharge: 2023-08-23 | Disposition: A | Discharge status: Home / Self Care | Payer: Medicare Other | Attending: Emergency Medicine | Admitting: Emergency Medicine

## 2023-08-23 ENCOUNTER — Other Ambulatory Visit: Payer: Self-pay

## 2023-08-23 DIAGNOSIS — S0993XA Unspecified injury of face, initial encounter: Secondary | ICD-10-CM | POA: Diagnosis present

## 2023-08-23 DIAGNOSIS — J45909 Unspecified asthma, uncomplicated: Secondary | ICD-10-CM | POA: Diagnosis not present

## 2023-08-23 DIAGNOSIS — N281 Cyst of kidney, acquired: Secondary | ICD-10-CM | POA: Insufficient documentation

## 2023-08-23 DIAGNOSIS — I700 Atherosclerosis of aorta: Secondary | ICD-10-CM | POA: Insufficient documentation

## 2023-08-23 DIAGNOSIS — I2510 Atherosclerotic heart disease of native coronary artery without angina pectoris: Secondary | ICD-10-CM | POA: Diagnosis not present

## 2023-08-23 DIAGNOSIS — R0781 Pleurodynia: Secondary | ICD-10-CM | POA: Diagnosis not present

## 2023-08-23 DIAGNOSIS — I4891 Unspecified atrial fibrillation: Secondary | ICD-10-CM | POA: Insufficient documentation

## 2023-08-23 DIAGNOSIS — I7300 Raynaud's syndrome without gangrene: Secondary | ICD-10-CM | POA: Diagnosis not present

## 2023-08-23 DIAGNOSIS — R911 Solitary pulmonary nodule: Secondary | ICD-10-CM | POA: Diagnosis not present

## 2023-08-23 DIAGNOSIS — J449 Chronic obstructive pulmonary disease, unspecified: Secondary | ICD-10-CM | POA: Insufficient documentation

## 2023-08-23 DIAGNOSIS — W01198A Fall on same level from slipping, tripping and stumbling with subsequent striking against other object, initial encounter: Secondary | ICD-10-CM | POA: Diagnosis not present

## 2023-08-23 DIAGNOSIS — Z23 Encounter for immunization: Secondary | ICD-10-CM | POA: Diagnosis not present

## 2023-08-23 DIAGNOSIS — S0240DA Maxillary fracture, left side, initial encounter for closed fracture: Secondary | ICD-10-CM | POA: Diagnosis not present

## 2023-08-23 DIAGNOSIS — I517 Cardiomegaly: Secondary | ICD-10-CM | POA: Insufficient documentation

## 2023-08-23 DIAGNOSIS — Z7901 Long term (current) use of anticoagulants: Secondary | ICD-10-CM | POA: Diagnosis not present

## 2023-08-23 DIAGNOSIS — W19XXXA Unspecified fall, initial encounter: Secondary | ICD-10-CM

## 2023-08-23 LAB — CBC WITH DIFFERENTIAL/PLATELET
Abs Immature Granulocytes: 0.02 10*3/uL (ref 0.00–0.07)
Basophils Absolute: 0.1 10*3/uL (ref 0.0–0.1)
Basophils Relative: 1 %
Eosinophils Absolute: 0.1 10*3/uL (ref 0.0–0.5)
Eosinophils Relative: 1 %
HCT: 45.4 % (ref 36.0–46.0)
Hemoglobin: 14.3 g/dL (ref 12.0–15.0)
Immature Granulocytes: 0 %
Lymphocytes Relative: 13 %
Lymphs Abs: 0.8 10*3/uL (ref 0.7–4.0)
MCH: 31.1 pg (ref 26.0–34.0)
MCHC: 31.5 g/dL (ref 30.0–36.0)
MCV: 98.7 fL (ref 80.0–100.0)
Monocytes Absolute: 0.6 10*3/uL (ref 0.1–1.0)
Monocytes Relative: 9 %
Neutro Abs: 5.1 10*3/uL (ref 1.7–7.7)
Neutrophils Relative %: 76 %
Platelets: 210 10*3/uL (ref 150–400)
RBC: 4.6 MIL/uL (ref 3.87–5.11)
RDW: 14.6 % (ref 11.5–15.5)
WBC: 6.7 10*3/uL (ref 4.0–10.5)
nRBC: 0 % (ref 0.0–0.2)

## 2023-08-23 LAB — COMPREHENSIVE METABOLIC PANEL
ALT: 24 U/L (ref 0–44)
AST: 30 U/L (ref 15–41)
Albumin: 3.6 g/dL (ref 3.5–5.0)
Alkaline Phosphatase: 58 U/L (ref 38–126)
Anion gap: 10 (ref 5–15)
BUN: 29 mg/dL — ABNORMAL HIGH (ref 8–23)
CO2: 27 mmol/L (ref 22–32)
Calcium: 9.4 mg/dL (ref 8.9–10.3)
Chloride: 99 mmol/L (ref 98–111)
Creatinine, Ser: 1.32 mg/dL — ABNORMAL HIGH (ref 0.44–1.00)
GFR, Estimated: 42 mL/min — ABNORMAL LOW (ref >60)
Glucose, Bld: 103 mg/dL — ABNORMAL HIGH (ref 70–99)
Potassium: 4.5 mmol/L (ref 3.5–5.1)
Sodium: 136 mmol/L (ref 135–145)
Total Bilirubin: 0.5 mg/dL (ref 0.3–1.2)
Total Protein: 6.6 g/dL (ref 6.5–8.1)

## 2023-08-23 LAB — MAGNESIUM: Magnesium: 2 mg/dL (ref 1.7–2.4)

## 2023-08-23 MED ORDER — LACTATED RINGERS IV BOLUS
500.0000 mL | Freq: Once | INTRAVENOUS | Status: AC
  Administered 2023-08-23: 500 mL via INTRAVENOUS

## 2023-08-23 MED ORDER — TETANUS-DIPHTH-ACELL PERTUSSIS 5-2.5-18.5 LF-MCG/0.5 IM SUSY
0.5000 mL | PREFILLED_SYRINGE | Freq: Once | INTRAMUSCULAR | Status: AC
  Administered 2023-08-23: 0.5 mL via INTRAMUSCULAR
  Filled 2023-08-23: qty 0.5

## 2023-08-23 MED ORDER — METOCLOPRAMIDE HCL 5 MG/ML IJ SOLN
10.0000 mg | Freq: Once | INTRAMUSCULAR | Status: AC
  Administered 2023-08-23: 10 mg via INTRAVENOUS
  Filled 2023-08-23: qty 2

## 2023-08-23 MED ORDER — ACETAMINOPHEN 325 MG PO TABS
650.0000 mg | ORAL_TABLET | Freq: Once | ORAL | Status: AC
  Administered 2023-08-23: 650 mg via ORAL
  Filled 2023-08-23: qty 2

## 2023-08-23 MED ORDER — DIPHENHYDRAMINE HCL 50 MG/ML IJ SOLN
12.5000 mg | Freq: Once | INTRAMUSCULAR | Status: AC
  Administered 2023-08-23: 12.5 mg via INTRAVENOUS
  Filled 2023-08-23: qty 1

## 2023-08-23 NOTE — ED Triage Notes (Signed)
Pt tripped over the curb in parking lot; pt states she was not paying attention to where she was walking and tripped and fell; pt states she fell on her left side and is c/o pain to left side of head, ribs and hand; pt has an abrasion to left palm; pt denies any loc and states she is on eliquis

## 2023-08-23 NOTE — ED Notes (Signed)
Pt returned from CT °

## 2023-08-23 NOTE — Discharge Instructions (Addendum)
Maintain soft diet for the next 4 weeks.  Avoid blowing your nose forcefully.  Take Tylenol as needed for pain.  Call the telephone number below for ear, nose, and throat doctor follow-up.  Return to the emergency department for any new or worsening symptoms of concern.

## 2023-08-23 NOTE — ED Notes (Signed)
Pt states visual disturbances behind left eye along with headache.

## 2023-08-23 NOTE — ED Provider Notes (Signed)
[place] EMERGENCY DEPARTMENT AT [facility] Provider Note   CSN: [id number] Arrival date & time: 08/23/23  9629     History  Chief Complaint  Patient presents with   Gina Mcbride is a 77 y.o. female.  HPI Patient presents after fall.  Medical history includes asthma, atrial fibrillation, COPD, Raynaud's disease, osteoporosis.  She is on Eliquis.  While walking into the hospital to visit somebody, patient tripped on a curb.  This caused her to fall forward.  She struck the left side of her head.  She has since had a headache.  She has mild pain to the left side of her face.  She sustained an abrasion on her left hypothenar eminence.  She was able to stand back up.  Other than her head, she denies any areas of significant pain.    Home Medications Prior to Admission medications   Medication Sig Start Date End Date Taking? Authorizing Provider  Calcium Citrate-Vitamin D (CALCIUM + D PO) Take by mouth. lunchtime    [provider]  celecoxib (CELEBREX) 200 MG capsule Take by mouth. 06/08/21   [provider]  Cholecalciferol (VITAMIN D PO) Take by mouth. lunchtime    [provider]  clonazePAM (KLONOPIN) 1 MG tablet Take 1 mg by mouth at bedtime.    [provider]  denosumab (PROLIA) 60 MG/ML SOLN injection Inject 60 mg into the skin every 6 (six) months. Administer in upper arm, thigh, or abdomen Patient not taking: Reported on 12/05/2021    [provider]  etodolac (LODINE) 400 MG tablet Take 400 mg by mouth daily.  Patient not taking: Reported on 12/05/2021 01/01/20   [provider]  hydrOXYzine (VISTARIL) 25 MG capsule Take 25 mg by mouth 2 (two) times daily. Patient not taking: Reported on 12/05/2021 10/13/21   [provider]  metoprolol succinate (TOPROL-XL) 50 MG 24 hr tablet Take 1 tablet (50 mg total) by mouth daily. Take with or immediately following a meal. 03/30/21   Gollan, Tollie Pizza, MD   prednisoLONE acetate (PRED FORTE) 1 % ophthalmic suspension Place 1 drop into the right eye daily.    [provider]  rosuvastatin (CRESTOR) 5 MG tablet Take 1 tablet (5 mg total) by mouth daily. 03/30/21   Antonieta Iba, MD      Allergies    Erythromycin base, Other, Oxytetracycline, and Shrimp [shellfish allergy]    Review of Systems   Review of Systems  Skin:  Positive for wound.  Neurological:  Positive for headaches.  All other systems reviewed and are negative.   Physical Exam Updated Vital Signs BP 134/81   Pulse 67   Temp 97.6 F (36.4 C)   Resp 19   Ht 5\' 4"  (1.626 m)   Wt 51.7 kg   SpO2 95%   BMI 19.57 kg/m  Physical Exam Vitals and nursing note reviewed.  Constitutional:      General: She is not in acute distress.    Appearance: Normal appearance. She is well-developed. She is not ill-appearing, toxic-appearing or diaphoretic.  HENT:     Head: Normocephalic and atraumatic.     Right Ear: External ear normal.     Left Ear: External ear normal.     Mouth/Throat:     Mouth: Mucous membranes are moist.  Eyes:     Extraocular Movements: Extraocular movements intact.     Conjunctiva/sclera: Conjunctivae normal.  Cardiovascular:     Rate and  Rhythm: Normal rate and regular rhythm.  Pulmonary:     Effort: Pulmonary effort is normal. No respiratory distress.  Chest:     Chest wall: No tenderness.  Abdominal:     General: There is no distension.     Palpations: Abdomen is soft.     Tenderness: There is no abdominal tenderness.  Musculoskeletal:        General: No swelling or deformity. Normal range of motion.     Cervical back: Normal range of motion and neck supple.     Right lower leg: No edema.     Left lower leg: No edema.  Skin:    General: Skin is warm and dry.     Capillary Refill: Capillary refill takes less than 2 seconds.     Coloration: Skin is not jaundiced or pale.  Neurological:     General: No focal deficit present.      Mental Status: She is alert and oriented to person, place, and time.     Cranial Nerves: No cranial nerve deficit.     Sensory: No sensory deficit.     Motor: No weakness.     Coordination: Coordination normal.  Psychiatric:        Mood and Affect: Mood normal.        Behavior: Behavior normal.        Thought Content: Thought content normal.        Judgment: Judgment normal.     ED Results / Procedures / Treatments   Labs (all labs ordered are listed, but only abnormal results are displayed) Labs Reviewed  COMPREHENSIVE METABOLIC PANEL - Abnormal; Notable for the following components:      Result Value   Glucose, Bld 103 (*)    BUN 29 (*)    Creatinine, Ser 1.32 (*)    GFR, Estimated 42 (*)    All other components within normal limits  CBC WITH DIFFERENTIAL/PLATELET  MAGNESIUM    EKG None  Radiology DG Wrist Complete Left  Result Date: 08/23/2023 CLINICAL DATA:  Pain after fall EXAM: LEFT WRIST - COMPLETE 4 VIEW COMPARISON:  None Available. FINDINGS: No acute fracture or dislocation. Preserved bone mineralization. Moderate degenerative changes of the first carpometacarpal joint with sclerosis, joint space loss and osteophyte formation. Smoothly marginated lucency along the lunate may be a subchondral cyst or geode. Overall if there is persistent pain or further concern such as for scaphoid injury recommend treatment with follow-up imaging in 7-10 days to assess for occult abnormality. IMPRESSION: Degenerative changes of the first carpometacarpal joint. Lunate subchondral cyst or geode. Electronically Signed   By: Karen Kays M.D.   On: 08/23/2023 14:56   CT Maxillofacial Wo Contrast  Result Date: 08/23/2023 CLINICAL DATA:  Trip and fall injury to left side of head, headache EXAM: CT MAXILLOFACIAL WITHOUT CONTRAST TECHNIQUE: Multidetector CT imaging of the maxillofacial structures was performed. Multiplanar CT image reconstructions were also generated. RADIATION DOSE  REDUCTION: This exam was performed according to the departmental dose-optimization program which includes automated exposure control, adjustment of the mA and/or kV according to patient size and/or use of iterative reconstruction technique. COMPARISON:  None Available. FINDINGS: Osseous: Displaced, impacted tripod morphology fractures of the left zygoma and maxilla, notable for elevated fracture fragments of the left orbital floor, 0.5 cm elevation (series 8, image 33, series 3, image 34). Orbits: Negative. No traumatic or inflammatory finding. Sinuses: Hemorrhage within the left maxillary sinus. Soft tissues: Negative. Limited intracranial: No significant or unexpected finding.  IMPRESSION: 1. Displaced, impacted tripod morphology fractures of the left zygoma and maxilla, notable for elevated fracture fragments of the left orbital floor, 0.5 cm elevation. 2. Left orbital contents are intact by noncontrast CT. 3. Hemorrhage within the left maxillary sinus. Electronically Signed   By: Jearld Lesch M.D.   On: 08/23/2023 13:36   CT CHEST ABDOMEN PELVIS WO CONTRAST  Result Date: 08/23/2023 CLINICAL DATA:  Trauma, trip and fall, pain to left ribs EXAM: CT CHEST, ABDOMEN AND PELVIS WITHOUT CONTRAST CT THORACIC AND LUMBAR SPINE WITHOUT CONTRAST TECHNIQUE: Multidetector CT imaging of the chest, abdomen and pelvis was performed following the standard protocol without IV contrast. Multidetector CT imaging of the thoracic and lumbar spine was performed following the standard protocol without IV contrast. RADIATION DOSE REDUCTION: This exam was performed according to the departmental dose-optimization program which includes automated exposure control, adjustment of the mA and/or kV according to patient size and/or use of iterative reconstruction technique. COMPARISON:  12/05/2021 FINDINGS: CT CHEST FINDINGS Cardiovascular: Aortic atherosclerosis. Cardiomegaly. Left and right coronary artery calcifications. No pericardial  effusion. Mediastinum/Nodes: No enlarged mediastinal, hilar, or axillary lymph nodes. Thyroid gland, trachea, and esophagus demonstrate no significant findings. Lungs/Pleura: Biapical pleural-parenchymal scarring. Occasional tiny bilateral pulmonary nodules, unchanged and definitively benign, requiring no specific further follow-up or characterization. No pleural effusion or pneumothorax. Musculoskeletal: No chest wall mass or suspicious osseous lesions identified. CT ABDOMEN PELVIS FINDINGS Hepatobiliary: No solid liver abnormality is seen. No gallstones, gallbladder wall thickening, or biliary dilatation. Pancreas: Unremarkable. No pancreatic ductal dilatation or surrounding inflammatory changes. Spleen: Normal in size without significant abnormality. Adrenals/Urinary Tract: Adrenal glands are unremarkable. Simple, benign right renal cortical cysts, for which no further follow-up or characterization is required kidneys are otherwise normal, without renal calculi, obvious solid lesion, or hydronephrosis. Bladder is unremarkable. Stomach/Bowel: Stomach is within normal limits. Appendix appears normal. No evidence of bowel wall thickening, distention, or inflammatory changes. Vascular/Lymphatic: Aortic atherosclerosis. No enlarged abdominal or pelvic lymph nodes. Reproductive: No mass or other abnormality. Other: No abdominal wall hernia or abnormality. No ascites. Musculoskeletal: No acute osseous findings. CT THORACIC AND LUMBAR SPINE FINDINGS Alignment: Normal thoracic kyphosis. Normal lumbar lordosis. Vertebral bodies: Intact. No fracture or dislocation. Disc spaces: Minimal multilevel disc degenerative disease throughout the thoracic and lumbar spine. Paraspinous soft tissues: Unremarkable. IMPRESSION: 1. No noncontrast CT evidence of acute traumatic injury to the chest, abdomen, or pelvis. 2. No fracture or dislocation of the thoracic or lumbar spine. 3. Cardiomegaly and coronary artery disease. Aortic  Atherosclerosis (ICD10-I70.0). Electronically Signed   By: Jearld Lesch M.D.   On: 08/23/2023 13:29   CT T-SPINE NO CHARGE  Result Date: 08/23/2023 CLINICAL DATA:  Trauma, trip and fall, pain to left ribs EXAM: CT CHEST, ABDOMEN AND PELVIS WITHOUT CONTRAST CT THORACIC AND LUMBAR SPINE WITHOUT CONTRAST TECHNIQUE: Multidetector CT imaging of the chest, abdomen and pelvis was performed following the standard protocol without IV contrast. Multidetector CT imaging of the thoracic and lumbar spine was performed following the standard protocol without IV contrast. RADIATION DOSE REDUCTION: This exam was performed according to the departmental dose-optimization program which includes automated exposure control, adjustment of the mA and/or kV according to patient size and/or use of iterative reconstruction technique. COMPARISON:  12/05/2021 FINDINGS: CT CHEST FINDINGS Cardiovascular: Aortic atherosclerosis. Cardiomegaly. Left and right coronary artery calcifications. No pericardial effusion. Mediastinum/Nodes: No enlarged mediastinal, hilar, or axillary lymph nodes. Thyroid gland, trachea, and esophagus demonstrate no significant findings. Lungs/Pleura: Biapical pleural-parenchymal scarring. Occasional tiny bilateral  pulmonary nodules, unchanged and definitively benign, requiring no specific further follow-up or characterization. No pleural effusion or pneumothorax. Musculoskeletal: No chest wall mass or suspicious osseous lesions identified. CT ABDOMEN PELVIS FINDINGS Hepatobiliary: No solid liver abnormality is seen. No gallstones, gallbladder wall thickening, or biliary dilatation. Pancreas: Unremarkable. No pancreatic ductal dilatation or surrounding inflammatory changes. Spleen: Normal in size without significant abnormality. Adrenals/Urinary Tract: Adrenal glands are unremarkable. Simple, benign right renal cortical cysts, for which no further follow-up or characterization is required kidneys are otherwise  normal, without renal calculi, obvious solid lesion, or hydronephrosis. Bladder is unremarkable. Stomach/Bowel: Stomach is within normal limits. Appendix appears normal. No evidence of bowel wall thickening, distention, or inflammatory changes. Vascular/Lymphatic: Aortic atherosclerosis. No enlarged abdominal or pelvic lymph nodes. Reproductive: No mass or other abnormality. Other: No abdominal wall hernia or abnormality. No ascites. Musculoskeletal: No acute osseous findings. CT THORACIC AND LUMBAR SPINE FINDINGS Alignment: Normal thoracic kyphosis. Normal lumbar lordosis. Vertebral bodies: Intact. No fracture or dislocation. Disc spaces: Minimal multilevel disc degenerative disease throughout the thoracic and lumbar spine. Paraspinous soft tissues: Unremarkable. IMPRESSION: 1. No noncontrast CT evidence of acute traumatic injury to the chest, abdomen, or pelvis. 2. No fracture or dislocation of the thoracic or lumbar spine. 3. Cardiomegaly and coronary artery disease. Aortic Atherosclerosis (ICD10-I70.0). Electronically Signed   By: Jearld Lesch M.D.   On: 08/23/2023 13:29   CT L-SPINE NO CHARGE  Result Date: 08/23/2023 CLINICAL DATA:  Trauma, trip and fall, pain to left ribs EXAM: CT CHEST, ABDOMEN AND PELVIS WITHOUT CONTRAST CT THORACIC AND LUMBAR SPINE WITHOUT CONTRAST TECHNIQUE: Multidetector CT imaging of the chest, abdomen and pelvis was performed following the standard protocol without IV contrast. Multidetector CT imaging of the thoracic and lumbar spine was performed following the standard protocol without IV contrast. RADIATION DOSE REDUCTION: This exam was performed according to the departmental dose-optimization program which includes automated exposure control, adjustment of the mA and/or kV according to patient size and/or use of iterative reconstruction technique. COMPARISON:  12/05/2021 FINDINGS: CT CHEST FINDINGS Cardiovascular: Aortic atherosclerosis. Cardiomegaly. Left and right coronary  artery calcifications. No pericardial effusion. Mediastinum/Nodes: No enlarged mediastinal, hilar, or axillary lymph nodes. Thyroid gland, trachea, and esophagus demonstrate no significant findings. Lungs/Pleura: Biapical pleural-parenchymal scarring. Occasional tiny bilateral pulmonary nodules, unchanged and definitively benign, requiring no specific further follow-up or characterization. No pleural effusion or pneumothorax. Musculoskeletal: No chest wall mass or suspicious osseous lesions identified. CT ABDOMEN PELVIS FINDINGS Hepatobiliary: No solid liver abnormality is seen. No gallstones, gallbladder wall thickening, or biliary dilatation. Pancreas: Unremarkable. No pancreatic ductal dilatation or surrounding inflammatory changes. Spleen: Normal in size without significant abnormality. Adrenals/Urinary Tract: Adrenal glands are unremarkable. Simple, benign right renal cortical cysts, for which no further follow-up or characterization is required kidneys are otherwise normal, without renal calculi, obvious solid lesion, or hydronephrosis. Bladder is unremarkable. Stomach/Bowel: Stomach is within normal limits. Appendix appears normal. No evidence of bowel wall thickening, distention, or inflammatory changes. Vascular/Lymphatic: Aortic atherosclerosis. No enlarged abdominal or pelvic lymph nodes. Reproductive: No mass or other abnormality. Other: No abdominal wall hernia or abnormality. No ascites. Musculoskeletal: No acute osseous findings. CT THORACIC AND LUMBAR SPINE FINDINGS Alignment: Normal thoracic kyphosis. Normal lumbar lordosis. Vertebral bodies: Intact. No fracture or dislocation. Disc spaces: Minimal multilevel disc degenerative disease throughout the thoracic and lumbar spine. Paraspinous soft tissues: Unremarkable. IMPRESSION: 1. No noncontrast CT evidence of acute traumatic injury to the chest, abdomen, or pelvis. 2. No fracture or dislocation of the thoracic  or lumbar spine. 3. Cardiomegaly and  coronary artery disease. Aortic Atherosclerosis (ICD10-I70.0). Electronically Signed   By: Jearld Lesch M.D.   On: 08/23/2023 13:29   DG Pelvis Portable  Result Date: 08/23/2023 CLINICAL DATA:  Fall. EXAM: PORTABLE PELVIS 1-2 VIEWS COMPARISON:  None Available. FINDINGS: There is no evidence of pelvic fracture or diastasis. The sacroiliac joints and pubic symphysis are anatomically aligned. The hip joints are anatomically aligned. IMPRESSION: No acute osseous abnormality on AP pelvic radiograph. Electronically Signed   By: Hart Robinsons M.D.   On: 08/23/2023 12:00   DG Chest Port 1 View  Result Date: 08/23/2023 CLINICAL DATA:  Fall. EXAM: PORTABLE CHEST 1 VIEW COMPARISON:  Chest radiograph dated December 05, 2021. FINDINGS: The heart size and mediastinal contours are within normal limits. Aortic calcification. Both lungs are clear. No pneumothorax or sizable pleural effusion. No acute osseous abnormality. No displaced rib fracture identified. IMPRESSION: No acute cardiopulmonary findings. No displaced rib fracture identified. Electronically Signed   By: Hart Robinsons M.D.   On: 08/23/2023 11:58   CT CERVICAL SPINE WO CONTRAST  Result Date: 08/23/2023 CLINICAL DATA:  Head trauma, moderate-severe; Polytrauma, blunt EXAM: CT HEAD WITHOUT CONTRAST CT CERVICAL SPINE WITHOUT CONTRAST TECHNIQUE: Multidetector CT imaging of the head and cervical spine was performed following the standard protocol without intravenous contrast. Multiplanar CT image reconstructions of the cervical spine were also generated. RADIATION DOSE REDUCTION: This exam was performed according to the departmental dose-optimization program which includes automated exposure control, adjustment of the mA and/or kV according to patient size and/or use of iterative reconstruction technique. COMPARISON:  CT Head nad C Spine 12/05/21 FINDINGS: CT HEAD FINDINGS Brain: No hemorrhage. No hydrocephalus. No extra-axial fluid collection. No CT  evidence of an acute cortical infarct. No mass effect. No mass lesion. Vascular: No hyperdense vessel or unexpected calcification. Skull: Normal. Negative for fracture or focal lesion. Sinuses/Orbits: No middle ear or mastoid effusion. There is an air-fluid level in the left maxillary sinus. There is orbital floor fracture and a fracture of the lateral orbital wall on the left. There may also be a fracture of the left zygomatic process. Other: None. CT CERVICAL SPINE FINDINGS Alignment: Normal. Skull base and vertebrae: No acute fracture. No primary bone lesion or focal pathologic process. Soft tissues and spinal canal: No prevertebral fluid or swelling. No visible canal hematoma. Disc levels:  No evidence of high-grade spinal canal stenosis Upper chest: Biapical pleural-parenchymal scarring. Other: None IMPRESSION: 1. No CT evidence of intracranial injury. 2. No acute fracture or traumatic subluxation of the cervical spine. 3. Possible [facility] complex fracture pattern on the left. Recommend further evaluation with a dedicated facial bone CT. Electronically Signed   By: Lorenza Cambridge M.D.   On: 08/23/2023 10:16   CT HEAD WO CONTRAST  Result Date: 08/23/2023 CLINICAL DATA:  Head trauma, moderate-severe; Polytrauma, blunt EXAM: CT HEAD WITHOUT CONTRAST CT CERVICAL SPINE WITHOUT CONTRAST TECHNIQUE: Multidetector CT imaging of the head and cervical spine was performed following the standard protocol without intravenous contrast. Multiplanar CT image reconstructions of the cervical spine were also generated. RADIATION DOSE REDUCTION: This exam was performed according to the departmental dose-optimization program which includes automated exposure control, adjustment of the mA and/or kV according to patient size and/or use of iterative reconstruction technique. COMPARISON:  CT Head nad C Spine 12/05/21 FINDINGS: CT HEAD FINDINGS Brain: No hemorrhage. No hydrocephalus. No extra-axial fluid collection. No CT evidence of an  acute cortical infarct. No mass effect. No  mass lesion. Vascular: No hyperdense vessel or unexpected calcification. Skull: Normal. Negative for fracture or focal lesion. Sinuses/Orbits: No middle ear or mastoid effusion. There is an air-fluid level in the left maxillary sinus. There is orbital floor fracture and a fracture of the lateral orbital wall on the left. There may also be a fracture of the left zygomatic process. Other: None. CT CERVICAL SPINE FINDINGS Alignment: Normal. Skull base and vertebrae: No acute fracture. No primary bone lesion or focal pathologic process. Soft tissues and spinal canal: No prevertebral fluid or swelling. No visible canal hematoma. Disc levels:  No evidence of high-grade spinal canal stenosis Upper chest: Biapical pleural-parenchymal scarring. Other: None IMPRESSION: 1. No CT evidence of intracranial injury. 2. No acute fracture or traumatic subluxation of the cervical spine. 3. Possible ZMC complex fracture pattern on the left. Recommend further evaluation with a dedicated facial bone CT. Electronically Signed   By: Lorenza Cambridge M.D.   On: 08/23/2023 10:16    Procedures Procedures    Medications Ordered in ED Medications  acetaminophen (TYLENOL) tablet 650 mg (650 mg Oral Given 08/23/23 1033)  Tdap (BOOSTRIX) injection 0.5 mL (0.5 mLs Intramuscular Given 08/23/23 1034)  metoCLOPramide (REGLAN) injection 10 mg (10 mg Intravenous Given 08/23/23 1153)  diphenhydrAMINE (BENADRYL) injection 12.5 mg (12.5 mg Intravenous Given 08/23/23 1152)  lactated ringers bolus 500 mL ( Intravenous Stopped 08/23/23 1239)    ED Course/ Medical Decision Making/ A&P                                 Medical Decision Making Amount and/or Complexity of Data Reviewed Labs: ordered. Radiology: ordered.  Risk OTC drugs. Prescription drug management.   This patient presents to the ED for concern of fall, this involves an extensive number of treatment options, and is a complaint  that carries with it a high risk of complications and morbidity.  The differential diagnosis includes acute injuries   Co morbidities that complicate the patient evaluation  Atrial fibrillation, COPD, osteoporosis   Additional history obtained:  Additional history obtained from patient's family External records from outside source obtained and reviewed including EMR   Lab Tests:  I Ordered, and personally interpreted labs.  The pertinent results include: Creatinine slightly increased from baseline, normal hemoglobin, no leukocytosis, normal electrolytes   Imaging Studies ordered:  I ordered imaging studies including x-ray of chest and pelvis; CT scan of head, cervical spine, face, chest, abdomen, pelvis, T-spine, L-spine I independently visualized and interpreted imaging which showed displaced and impacted tripod morphology fractures of left zygoma maxilla, elevated fracture fragments of left orbital floor I agree with the radiologist interpretation   Cardiac Monitoring: / EKG:  The patient was maintained on a cardiac monitor.  I personally viewed and interpreted the cardiac monitored which showed an underlying rhythm of: Sinus rhythm   Consultations Obtained:  I requested consultation with the otolaryngologist, Dr. Elijah Birk,  and discussed lab and imaging findings as well as pertinent plan - they recommend: Soft diet for the next 4 weeks and outpatient follow-up as needed   Problem List / ED Course / Critical interventions / Medication management  Patient presents after mechanical fall.  This occurred right outside the emergency department, she was coming into visit the patient.  She did strike the left side of her head.  She is on Eliquis.  She does have a headache.  CT imaging was ordered.  Currently, she does  not have any focal neurologic deficits.  She has no areas of significant tenderness or deformity.  She has a very mild abrasion to left hypothenar eminence.  She  reports that she was able to stand and ambulate since the fall.  Tylenol was ordered for analgesia.  On CT imaging, patient does have facial fractures.  This includes displaced impacted troponin falls or fractures of left zygoma and maxilla in addition to left open for fracture I discussed these findings with otolaryngologist on-call, Dr. Elijah Birk, who recommend soft diet for the next 4 weeks and outpatient follow-up as needed.  Patient was informed of her injuries.  She was advised to avoid blowing her nose and avoid chewing hard foods.  She was given instructions for postconcussive syndrome.  She was discharged in stable condition. I ordered medication including Tdap for tetanus prophylaxis; Tylenol, Benadryl, Reglan, IV fluids for headache Reevaluation of the patient after these medicines showed that the patient improved I have reviewed the patients home medicines and have made adjustments as needed   Social Determinants of Health:  Lives independently        Final Clinical Impression(s) / ED Diagnoses Final diagnoses:  Closed fracture of left side of maxilla, initial encounter (HCC)  Fall, initial encounter    Rx / DC Orders ED Discharge Orders     None         Gloris Manchester, MD 08/23/23 1538

## 2023-10-04 ENCOUNTER — Other Ambulatory Visit (INDEPENDENT_AMBULATORY_CARE_PROVIDER_SITE_OTHER): Payer: Self-pay | Admitting: Vascular Surgery

## 2023-10-04 DIAGNOSIS — I6523 Occlusion and stenosis of bilateral carotid arteries: Secondary | ICD-10-CM

## 2023-10-06 ENCOUNTER — Ambulatory Visit (INDEPENDENT_AMBULATORY_CARE_PROVIDER_SITE_OTHER): Payer: Medicare Other | Admitting: Vascular Surgery

## 2023-10-06 ENCOUNTER — Encounter (INDEPENDENT_AMBULATORY_CARE_PROVIDER_SITE_OTHER): Payer: Medicare Other

## 2024-02-20 ENCOUNTER — Other Ambulatory Visit: Payer: Self-pay | Admitting: Nurse Practitioner

## 2024-02-20 DIAGNOSIS — Z1231 Encounter for screening mammogram for malignant neoplasm of breast: Secondary | ICD-10-CM

## 2024-02-21 ENCOUNTER — Telehealth (INDEPENDENT_AMBULATORY_CARE_PROVIDER_SITE_OTHER): Payer: Self-pay | Admitting: Vascular Surgery

## 2024-02-21 NOTE — Telephone Encounter (Signed)
 LVM for pt. Returning her call from yesterday regarding rescheduling an appt.

## 2024-03-05 ENCOUNTER — Ambulatory Visit
Admission: RE | Admit: 2024-03-05 | Discharge: 2024-03-05 | Disposition: A | Discharge status: Home / Self Care | Source: Ambulatory Visit | Attending: Nurse Practitioner | Admitting: Nurse Practitioner

## 2024-03-05 ENCOUNTER — Ambulatory Visit (INDEPENDENT_AMBULATORY_CARE_PROVIDER_SITE_OTHER)

## 2024-03-05 ENCOUNTER — Ambulatory Visit (INDEPENDENT_AMBULATORY_CARE_PROVIDER_SITE_OTHER): Admitting: Vascular Surgery

## 2024-03-05 ENCOUNTER — Encounter (INDEPENDENT_AMBULATORY_CARE_PROVIDER_SITE_OTHER): Payer: Self-pay | Admitting: Vascular Surgery

## 2024-03-05 VITALS — BP 128/70 | HR 67 | Resp 17 | Ht 64.0 in | Wt 115.2 lb

## 2024-03-05 DIAGNOSIS — I7300 Raynaud's syndrome without gangrene: Secondary | ICD-10-CM | POA: Diagnosis not present

## 2024-03-05 DIAGNOSIS — I4891 Unspecified atrial fibrillation: Secondary | ICD-10-CM | POA: Diagnosis not present

## 2024-03-05 DIAGNOSIS — I6523 Occlusion and stenosis of bilateral carotid arteries: Secondary | ICD-10-CM | POA: Diagnosis not present

## 2024-03-05 DIAGNOSIS — Z1231 Encounter for screening mammogram for malignant neoplasm of breast: Secondary | ICD-10-CM | POA: Diagnosis present

## 2024-03-05 DIAGNOSIS — E782 Mixed hyperlipidemia: Secondary | ICD-10-CM | POA: Diagnosis not present

## 2024-03-05 NOTE — Progress Notes (Signed)
 Subjective:    Patient ID: Gina Mcbride, female    DOB: Aug 15, 1946, 78 y.o.   MRN: [id number] Chief Complaint  Patient presents with   Venous Insufficiency   HPI Patient returns today in follow up of her carotid disease.  She is doing well.  She has no focal neurologic symptoms. Specifically, the patient denies amaurosis fugax, speech or swallowing difficulties, or arm or leg weakness or numbness.  Her carotid duplex today reveals stable 1 to 39% ICA stenosis bilaterally.  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  Vision is much better today.  Patient also had a syncopal episode in church last year.  Was found to be in atrial fibrillation.  She has been placed on anticoagulation with antiarrhythmias and endorses she is feeling much better today.    Review of Systems  Constitutional: Negative.   HENT: Negative.    Eyes: Negative.        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  Respiratory: Negative.    Cardiovascular: Negative.        Patient with new onset atrial fibrillation treated with anticoagulation.  Gastrointestinal: Negative.   Genitourinary: Negative.   Musculoskeletal: Negative.   Skin: Negative.   Allergic/Immunologic:       Patient with a longstanding history of Raynaud's disease.  Neurological: Negative.   Psychiatric/Behavioral: Negative.    All other systems reviewed and are negative.      Objective:   Physical Exam  BP 128/70 (BP Location: Right Arm, Patient Position: Sitting, Cuff Size: Small)   Pulse 67   Resp 17   Ht 5\' 4"  (1.626 m)   Wt 115 lb 3.2 oz (52.3 kg)   BMI 19.77 kg/m   Past Medical History:  Diagnosis Date   A-fib (HCC)    Asthma    Barlow's syndrome    COPD (chronic obstructive pulmonary disease) (HCC)    hx - no issues now   Dysrhythmia    rare - unnamed - followed by PCP, palpatations   Family history of adverse reaction to  anesthesia    Father - severe swelling after open heart surgery   Osteoporosis    Palpitations    Raynaud's disease without gangrene    Right tennis elbow    Thrombocytosis    Vertigo    yrs ago - no issues now   Vitamin D deficiency    Wears dentures    full upper, partial lower    Social History   Socioeconomic History   Marital status: Married    Spouse name: Not on file   Number of children: Not on file   Years of education: Not on file   Highest education level: Not on file  Occupational History   Not on file  Tobacco Use   Smoking status: Former    Current packs/day: 0.00    Average packs/day: 1 pack/day for 25.0 years (25.0 ttl pk-yrs)    Types: Cigarettes    Start date: 10/31/1969    Quit date: 10/31/1994    Years since quitting: 29.3   Smokeless tobacco: Never  Vaping Use   Vaping status: Never Used  Substance and Sexual Activity   Alcohol use: No   Drug use: Never   Sexual activity: Not on file  Other Topics Concern   Not on file  Social History Narrative   Not on file   Social Drivers of Health  Financial Resource Strain: Not on file  Food Insecurity: Not on file  Transportation Needs: Not on file  Physical Activity: Not on file  Stress: Not on file  Social Connections: Not on file  Intimate Partner Violence: Not on file    Past Surgical History:  Procedure Laterality Date   APPENDECTOMY     BLEPHAROPLASTY     CATARACT EXTRACTION W/PHACO Right 07/01/2015   Procedure: CATARACT EXTRACTION PHACO AND INTRAOCULAR LENS PLACEMENT (IOC);  Surgeon: Annell Kidney, MD;  Location: [facility];  Service: Ophthalmology;  Laterality: Right;   CATARACT EXTRACTION W/PHACO Left 11/15/2017   Procedure: CATARACT EXTRACTION PHACO AND INTRAOCULAR LENS PLACEMENT (IOC) LEFT;  Surgeon: Annell Kidney, MD;  Location: [facility];  Service: Ophthalmology;  Laterality: Left;   COLONOSCOPY     COLONOSCOPY WITH PROPOFOL  N/A 06/27/2018   Procedure:  COLONOSCOPY WITH PROPOFOL ;  Surgeon: Toledo, Alphonsus Jeans, MD;  Location: [facility];  Service: Gastroenterology;  Laterality: N/A;   ESOPHAGOGASTRODUODENOSCOPY (EGD) WITH PROPOFOL  N/A 06/27/2018   Procedure: ESOPHAGOGASTRODUODENOSCOPY (EGD) WITH PROPOFOL ;  Surgeon: Toledo, Alphonsus Jeans, MD;  Location: [facility];  Service: Gastroenterology;  Laterality: N/A;   TONSILLECTOMY     TUBAL LIGATION      Family History  Problem Relation Age of Onset   Breast cancer Sister 6    Allergies  Allergen Reactions   Erythromycin Base    Other Swelling    Hand contact with raw shrimp   Oxytetracycline Other (See Comments)    Mouth blisters (all -mycins) Mouth blisters (all -mycins)   Shrimp [Shellfish Allergy] Swelling    Hand contact with raw shrimp       Latest Ref Rng & Units 08/23/2023   11:47 AM 12/11/2021    1:29 PM 12/05/2021   11:49 AM  CBC  WBC 4.0 - 10.5 K/uL 6.7  6.3  5.5   Hemoglobin 12.0 - 15.0 g/dL 30.8  65.7  84.6   Hematocrit 36.0 - 46.0 % 45.4  39.5  46.3   Platelets 150 - 400 K/uL 210  241  285       CMP     Component Value Date/Time   NA 136 08/23/2023 1147   NA 141 07/16/2012 1202   K 4.5 08/23/2023 1147   K 4.0 07/16/2012 1202   CL 99 08/23/2023 1147   CL 106 07/16/2012 1202   CO2 27 08/23/2023 1147   CO2 29 07/16/2012 1202   GLUCOSE 103 (H) 08/23/2023 1147   GLUCOSE 90 07/16/2012 1202   BUN 29 (H) 08/23/2023 1147   BUN 11 07/16/2012 1202   CREATININE 1.32 (H) 08/23/2023 1147   CREATININE 0.72 07/16/2012 1202   CALCIUM  9.4 08/23/2023 1147   CALCIUM  9.2 07/16/2012 1202   PROT 6.6 08/23/2023 1147   PROT 7.1 07/16/2012 1202   ALBUMIN 3.6 08/23/2023 1147   ALBUMIN 3.9 07/16/2012 1202   AST 30 08/23/2023 1147   AST 27 07/16/2012 1202   ALT 24 08/23/2023 1147   ALT 20 07/16/2012 1202   ALKPHOS 58 08/23/2023 1147   ALKPHOS 81 07/16/2012 1202   BILITOT 0.5 08/23/2023 1147   BILITOT 0.5 07/16/2012 1202   GFRNONAA 42 (L) 08/23/2023 1147   GFRNONAA >60  07/16/2012 1202     No results found.     Assessment & Plan:   1. Bilateral carotid artery stenosis (Primary) Her duplex today shows mild 1 to 39% carotid artery stenosis bilaterally without significant progression from her previous study. No change in medical  regimen at this time other than an aspirin daily.  Follow-up on an annual basis for now.  2. Mixed hyperlipidemia Continue statin as ordered and reviewed, no changes at this time  3. Raynaud's disease without gangrene Symptoms fairly mild at this point although more noticeable in the cold weather.  4. Atrial fibrillation, unspecified type (HCC) Continue antiarrhythmia medications as already ordered, these medications have been reviewed and there are no changes at this time.  Continue anticoagulation as ordered by Cardiology Service  Current Outpatient Medications on File Prior to Visit  Medication Sig Dispense Refill   apixaban (ELIQUIS) 5 MG TABS tablet Take 5 mg by mouth.     Calcium  Citrate-Vitamin D (CALCIUM  + D PO) Take by mouth. lunchtime     celecoxib (CELEBREX) 200 MG capsule Take by mouth.     Cholecalciferol (VITAMIN D PO) Take by mouth. lunchtime     clonazePAM (KLONOPIN) 1 MG tablet Take 1 mg by mouth at bedtime.     denosumab (PROLIA) 60 MG/ML SOLN injection Inject 60 mg into the skin every 6 (six) months. Administer in upper arm, thigh, or abdomen     etodolac (LODINE) 400 MG tablet Take 400 mg by mouth daily.     hydrocortisone 2.5 % cream Apply topically.     meclizine (ANTIVERT) 25 MG tablet Take 25 mg by mouth every 6 (six) hours as needed.     metoprolol  succinate (TOPROL -XL) 50 MG 24 hr tablet Take 1 tablet (50 mg total) by mouth daily. Take with or immediately following a meal. (Patient taking differently: Take 25 mg by mouth daily. Take with or immediately following a meal.) 90 tablet 3   prednisoLONE acetate (PRED FORTE) 1 % ophthalmic suspension Place 1 drop into the right eye daily.     rosuvastatin   (CRESTOR ) 5 MG tablet Take 1 tablet (5 mg total) by mouth daily. 90 tablet 3   traMADol (ULTRAM) 50 MG tablet Take 25-50 mg by mouth.     hydrOXYzine (VISTARIL) 25 MG capsule Take 25 mg by mouth 2 (two) times daily. (Patient not taking: Reported on 12/05/2021)     No current facility-administered medications on file prior to visit.    There are no Patient Instructions on file for this visit. No follow-ups on file.   Annamaria Barrette, NP

## 2024-03-17 ENCOUNTER — Emergency Department

## 2024-03-17 ENCOUNTER — Other Ambulatory Visit: Payer: Self-pay

## 2024-03-17 ENCOUNTER — Inpatient Hospital Stay
Admission: EM | Admit: 2024-03-17 | Discharge: 2024-03-20 | DRG: 871 | Disposition: A | Discharge status: Home / Self Care | Attending: Internal Medicine | Admitting: Internal Medicine

## 2024-03-17 DIAGNOSIS — Z881 Allergy status to other antibiotic agents status: Secondary | ICD-10-CM

## 2024-03-17 DIAGNOSIS — R531 Weakness: Secondary | ICD-10-CM

## 2024-03-17 DIAGNOSIS — T364X5A Adverse effect of tetracyclines, initial encounter: Secondary | ICD-10-CM | POA: Diagnosis present

## 2024-03-17 DIAGNOSIS — Z7901 Long term (current) use of anticoagulants: Secondary | ICD-10-CM

## 2024-03-17 DIAGNOSIS — J440 Chronic obstructive pulmonary disease with acute lower respiratory infection: Secondary | ICD-10-CM | POA: Diagnosis present

## 2024-03-17 DIAGNOSIS — Z9049 Acquired absence of other specified parts of digestive tract: Secondary | ICD-10-CM

## 2024-03-17 DIAGNOSIS — J439 Emphysema, unspecified: Secondary | ICD-10-CM | POA: Diagnosis present

## 2024-03-17 DIAGNOSIS — R6520 Severe sepsis without septic shock: Secondary | ICD-10-CM | POA: Diagnosis present

## 2024-03-17 DIAGNOSIS — Z961 Presence of intraocular lens: Secondary | ICD-10-CM | POA: Diagnosis present

## 2024-03-17 DIAGNOSIS — T360X5A Adverse effect of penicillins, initial encounter: Secondary | ICD-10-CM | POA: Diagnosis present

## 2024-03-17 DIAGNOSIS — Z91013 Allergy to seafood: Secondary | ICD-10-CM

## 2024-03-17 DIAGNOSIS — I10 Essential (primary) hypertension: Secondary | ICD-10-CM | POA: Diagnosis present

## 2024-03-17 DIAGNOSIS — I7300 Raynaud's syndrome without gangrene: Secondary | ICD-10-CM | POA: Diagnosis present

## 2024-03-17 DIAGNOSIS — Z9841 Cataract extraction status, right eye: Secondary | ICD-10-CM

## 2024-03-17 DIAGNOSIS — K521 Toxic gastroenteritis and colitis: Secondary | ICD-10-CM | POA: Diagnosis present

## 2024-03-17 DIAGNOSIS — E872 Acidosis, unspecified: Secondary | ICD-10-CM | POA: Diagnosis present

## 2024-03-17 DIAGNOSIS — A419 Sepsis, unspecified organism: Principal | ICD-10-CM

## 2024-03-17 DIAGNOSIS — Z79899 Other long term (current) drug therapy: Secondary | ICD-10-CM

## 2024-03-17 DIAGNOSIS — Z23 Encounter for immunization: Secondary | ICD-10-CM

## 2024-03-17 DIAGNOSIS — E559 Vitamin D deficiency, unspecified: Secondary | ICD-10-CM | POA: Diagnosis present

## 2024-03-17 DIAGNOSIS — J9601 Acute respiratory failure with hypoxia: Secondary | ICD-10-CM | POA: Diagnosis present

## 2024-03-17 DIAGNOSIS — J181 Lobar pneumonia, unspecified organism: Secondary | ICD-10-CM

## 2024-03-17 DIAGNOSIS — Z87891 Personal history of nicotine dependence: Secondary | ICD-10-CM

## 2024-03-17 DIAGNOSIS — J189 Pneumonia, unspecified organism: Principal | ICD-10-CM

## 2024-03-17 DIAGNOSIS — R197 Diarrhea, unspecified: Secondary | ICD-10-CM | POA: Insufficient documentation

## 2024-03-17 DIAGNOSIS — Z9842 Cataract extraction status, left eye: Secondary | ICD-10-CM

## 2024-03-17 DIAGNOSIS — Z9089 Acquired absence of other organs: Secondary | ICD-10-CM

## 2024-03-17 DIAGNOSIS — I480 Paroxysmal atrial fibrillation: Secondary | ICD-10-CM

## 2024-03-17 DIAGNOSIS — T361X5A Adverse effect of cephalosporins and other beta-lactam antibiotics, initial encounter: Secondary | ICD-10-CM | POA: Diagnosis present

## 2024-03-17 DIAGNOSIS — Z1152 Encounter for screening for COVID-19: Secondary | ICD-10-CM

## 2024-03-17 DIAGNOSIS — Z9889 Other specified postprocedural states: Secondary | ICD-10-CM

## 2024-03-17 DIAGNOSIS — J180 Bronchopneumonia, unspecified organism: Secondary | ICD-10-CM | POA: Diagnosis present

## 2024-03-17 DIAGNOSIS — M810 Age-related osteoporosis without current pathological fracture: Secondary | ICD-10-CM | POA: Diagnosis present

## 2024-03-17 DIAGNOSIS — Z803 Family history of malignant neoplasm of breast: Secondary | ICD-10-CM

## 2024-03-17 DIAGNOSIS — Z9851 Tubal ligation status: Secondary | ICD-10-CM

## 2024-03-17 DIAGNOSIS — I6529 Occlusion and stenosis of unspecified carotid artery: Secondary | ICD-10-CM | POA: Diagnosis present

## 2024-03-17 DIAGNOSIS — J449 Chronic obstructive pulmonary disease, unspecified: Secondary | ICD-10-CM | POA: Insufficient documentation

## 2024-03-17 LAB — RESP PANEL BY RT-PCR (RSV, FLU A&B, COVID)  RVPGX2
Influenza A by PCR: NEGATIVE
Influenza B by PCR: NEGATIVE
Resp Syncytial Virus by PCR: NEGATIVE
SARS Coronavirus 2 by RT PCR: NEGATIVE

## 2024-03-17 LAB — URINALYSIS, W/ REFLEX TO CULTURE (INFECTION SUSPECTED)
Bacteria, UA: NONE SEEN
Bilirubin Urine: NEGATIVE
Glucose, UA: NEGATIVE mg/dL
Hgb urine dipstick: NEGATIVE
Ketones, ur: NEGATIVE mg/dL
Leukocytes,Ua: NEGATIVE
Nitrite: NEGATIVE
Protein, ur: NEGATIVE mg/dL
Specific Gravity, Urine: 1.02 (ref 1.005–1.030)
pH: 5 (ref 5.0–8.0)

## 2024-03-17 LAB — CBC WITH DIFFERENTIAL/PLATELET
Abs Immature Granulocytes: 0.05 10*3/uL (ref 0.00–0.07)
Basophils Absolute: 0 10*3/uL (ref 0.0–0.1)
Basophils Relative: 0 %
Eosinophils Absolute: 0 10*3/uL (ref 0.0–0.5)
Eosinophils Relative: 0 %
HCT: 53.9 % — ABNORMAL HIGH (ref 36.0–46.0)
Hemoglobin: 17 g/dL — ABNORMAL HIGH (ref 12.0–15.0)
Immature Granulocytes: 1 %
Lymphocytes Relative: 17 %
Lymphs Abs: 1.6 10*3/uL (ref 0.7–4.0)
MCH: 30.3 pg (ref 26.0–34.0)
MCHC: 31.5 g/dL (ref 30.0–36.0)
MCV: 96.1 fL (ref 80.0–100.0)
Monocytes Absolute: 1.1 10*3/uL — ABNORMAL HIGH (ref 0.1–1.0)
Monocytes Relative: 12 %
Neutro Abs: 6.6 10*3/uL (ref 1.7–7.7)
Neutrophils Relative %: 70 %
Platelets: 331 10*3/uL (ref 150–400)
RBC: 5.61 MIL/uL — ABNORMAL HIGH (ref 3.87–5.11)
RDW: 13.8 % (ref 11.5–15.5)
WBC: 9.5 10*3/uL (ref 4.0–10.5)
nRBC: 0 % (ref 0.0–0.2)

## 2024-03-17 LAB — TROPONIN I (HIGH SENSITIVITY)
Troponin I (High Sensitivity): 27 ng/L — ABNORMAL HIGH (ref <18)
Troponin I (High Sensitivity): 21 ng/L — ABNORMAL HIGH (ref <18)

## 2024-03-17 LAB — PHOSPHORUS: Phosphorus: 4.9 mg/dL — ABNORMAL HIGH (ref 2.5–4.6)

## 2024-03-17 LAB — COMPREHENSIVE METABOLIC PANEL WITH GFR
ALT: 22 U/L (ref 0–44)
AST: 40 U/L (ref 15–41)
Albumin: 3.2 g/dL — ABNORMAL LOW (ref 3.5–5.0)
Alkaline Phosphatase: 75 U/L (ref 38–126)
Anion gap: 13 (ref 5–15)
BUN: 34 mg/dL — ABNORMAL HIGH (ref 8–23)
CO2: 23 mmol/L (ref 22–32)
Calcium: 9.5 mg/dL (ref 8.9–10.3)
Chloride: 102 mmol/L (ref 98–111)
Creatinine, Ser: 1.27 mg/dL — ABNORMAL HIGH (ref 0.44–1.00)
GFR, Estimated: 43 mL/min — ABNORMAL LOW (ref >60)
Glucose, Bld: 100 mg/dL — ABNORMAL HIGH (ref 70–99)
Potassium: 4.2 mmol/L (ref 3.5–5.1)
Sodium: 138 mmol/L (ref 135–145)
Total Bilirubin: 0.9 mg/dL (ref 0.0–1.2)
Total Protein: 7.3 g/dL (ref 6.5–8.1)

## 2024-03-17 LAB — PROTIME-INR
INR: 1.2 (ref 0.8–1.2)
Prothrombin Time: 15.6 s — ABNORMAL HIGH (ref 11.4–15.2)

## 2024-03-17 LAB — LIPASE, BLOOD: Lipase: 51 U/L (ref 11–51)

## 2024-03-17 LAB — LACTIC ACID, PLASMA
Lactic Acid, Venous: 3.7 mmol/L (ref 0.5–1.9)
Lactic Acid, Venous: 2.7 mmol/L (ref 0.5–1.9)
Lactic Acid, Venous: 1.4 mmol/L (ref 0.5–1.9)

## 2024-03-17 LAB — MAGNESIUM: Magnesium: 2.3 mg/dL (ref 1.7–2.4)

## 2024-03-17 LAB — BRAIN NATRIURETIC PEPTIDE: B Natriuretic Peptide: 500.8 pg/mL — ABNORMAL HIGH (ref 0.0–100.0)

## 2024-03-17 MED ORDER — IPRATROPIUM-ALBUTEROL 0.5-2.5 (3) MG/3ML IN SOLN
3.0000 mL | Freq: Four times a day (QID) | RESPIRATORY_TRACT | Status: DC
  Administered 2024-03-17: 3 mL via RESPIRATORY_TRACT
  Filled 2024-03-17: qty 3

## 2024-03-17 MED ORDER — ONDANSETRON HCL 4 MG PO TABS: 4.0000 mg | ORAL_TABLET | Freq: Four times a day (QID) | ORAL | Status: DC | PRN

## 2024-03-17 MED ORDER — SODIUM CHLORIDE 0.9 % IV BOLUS (SEPSIS)
1000.0000 mL | Freq: Once | INTRAVENOUS | Status: AC
  Administered 2024-03-17: 1000 mL via INTRAVENOUS

## 2024-03-17 MED ORDER — METOPROLOL TARTRATE 5 MG/5ML IV SOLN
5.0000 mg | INTRAVENOUS | Status: DC | PRN
  Administered 2024-03-17: 5 mg via INTRAVENOUS
  Filled 2024-03-17 (×2): qty 5

## 2024-03-17 MED ORDER — PREDNISOLONE ACETATE 1 % OP SUSP
1.0000 [drp] | Freq: Two times a day (BID) | OPHTHALMIC | Status: DC
  Administered 2024-03-17 – 2024-03-20 (×5): 1 [drp] via OPHTHALMIC
  Filled 2024-03-17: qty 1
  Filled 2024-03-17: qty 5
  Filled 2024-03-17 (×2): qty 1

## 2024-03-17 MED ORDER — SODIUM CHLORIDE 0.9 % IV SOLN
2.0000 g | Freq: Once | INTRAVENOUS | Status: AC
  Administered 2024-03-17: 2 g via INTRAVENOUS
  Filled 2024-03-17: qty 20

## 2024-03-17 MED ORDER — METOPROLOL SUCCINATE ER 25 MG PO TB24
25.0000 mg | ORAL_TABLET | Freq: Every day | ORAL | Status: DC
  Administered 2024-03-17 – 2024-03-20 (×4): 25 mg via ORAL
  Filled 2024-03-17 (×4): qty 1

## 2024-03-17 MED ORDER — ONDANSETRON HCL 4 MG/2ML IJ SOLN
4.0000 mg | Freq: Once | INTRAMUSCULAR | Status: AC
  Administered 2024-03-17: 4 mg via INTRAVENOUS
  Filled 2024-03-17: qty 2

## 2024-03-17 MED ORDER — IPRATROPIUM-ALBUTEROL 0.5-2.5 (3) MG/3ML IN SOLN
3.0000 mL | Freq: Four times a day (QID) | RESPIRATORY_TRACT | Status: DC | PRN
Start: 2024-03-17 — End: 2024-03-20

## 2024-03-17 MED ORDER — CLONAZEPAM 1 MG PO TABS
1.0000 mg | ORAL_TABLET | Freq: Every day | ORAL | Status: DC
  Administered 2024-03-17 – 2024-03-19 (×3): 1 mg via ORAL
  Filled 2024-03-17 (×3): qty 1

## 2024-03-17 MED ORDER — ONDANSETRON HCL 4 MG/2ML IJ SOLN: 4.0000 mg | Freq: Four times a day (QID) | INTRAMUSCULAR | Status: DC | PRN

## 2024-03-17 MED ORDER — LACTATED RINGERS IV SOLN: INTRAVENOUS | Status: DC

## 2024-03-17 MED ORDER — BACID PO TABS: 2.0000 | ORAL_TABLET | Freq: Three times a day (TID) | ORAL | Status: DC

## 2024-03-17 MED ORDER — GUAIFENESIN ER 600 MG PO TB12
600.0000 mg | ORAL_TABLET | Freq: Two times a day (BID) | ORAL | Status: DC
  Administered 2024-03-17 – 2024-03-20 (×7): 600 mg via ORAL
  Filled 2024-03-17 (×7): qty 1

## 2024-03-17 MED ORDER — SODIUM CHLORIDE 0.9 % IV SOLN
1.0000 g | INTRAVENOUS | Status: DC
  Administered 2024-03-18 – 2024-03-19 (×2): 1 g via INTRAVENOUS
  Filled 2024-03-17 (×2): qty 10

## 2024-03-17 MED ORDER — SODIUM CHLORIDE 0.9 % IV SOLN
INTRAVENOUS | Status: DC
Start: 2024-03-17 — End: 2024-03-17

## 2024-03-17 MED ORDER — APIXABAN 5 MG PO TABS
5.0000 mg | ORAL_TABLET | Freq: Two times a day (BID) | ORAL | Status: DC
  Administered 2024-03-17 – 2024-03-20 (×7): 5 mg via ORAL
  Filled 2024-03-17 (×7): qty 1

## 2024-03-17 MED ORDER — LOPERAMIDE HCL 2 MG PO CAPS: 2.0000 mg | ORAL_CAPSULE | ORAL | Status: DC | PRN

## 2024-03-17 MED ORDER — PNEUMOCOCCAL 20-VAL CONJ VACC 0.5 ML IM SUSY
0.5000 mL | PREFILLED_SYRINGE | INTRAMUSCULAR | Status: AC
  Administered 2024-03-20: 0.5 mL via INTRAMUSCULAR
  Filled 2024-03-17: qty 0.5

## 2024-03-17 MED ORDER — SODIUM CHLORIDE 0.9 % IV SOLN
100.0000 mg | Freq: Two times a day (BID) | INTRAVENOUS | Status: DC
  Administered 2024-03-17 (×2): 100 mg via INTRAVENOUS
  Filled 2024-03-17 (×3): qty 100

## 2024-03-17 MED ORDER — LACTATED RINGERS IV BOLUS
1000.0000 mL | Freq: Once | INTRAVENOUS | Status: AC
  Administered 2024-03-17: 1000 mL via INTRAVENOUS

## 2024-03-17 MED ORDER — RISAQUAD PO CAPS
1.0000 | ORAL_CAPSULE | Freq: Every day | ORAL | Status: DC
  Administered 2024-03-17 – 2024-03-20 (×4): 1 via ORAL
  Filled 2024-03-17 (×4): qty 1

## 2024-03-17 MED ORDER — TRAMADOL HCL 50 MG PO TABS: 25.0000 mg | ORAL_TABLET | Freq: Two times a day (BID) | ORAL | Status: DC | PRN

## 2024-03-17 MED ORDER — ROSUVASTATIN CALCIUM 5 MG PO TABS
5.0000 mg | ORAL_TABLET | Freq: Every day | ORAL | Status: DC
  Administered 2024-03-17 – 2024-03-20 (×4): 5 mg via ORAL
  Filled 2024-03-17 (×4): qty 1

## 2024-03-17 NOTE — ED Provider Notes (Addendum)
 [facility] Provider Note    Event Date/Time   First MD Initiated Contact with Patient 03/17/24 [phone]     (approximate)   History   Shortness of Breath   HPI  Gina Mcbride is a 78 y.o. female past medical history significant for atrial fibrillation on Eliquis, carotid artery stenosis, present to the emergency department for not feeling well.  States that she started to have shortness of breath and a severe cough that started on Thursday.  Evaluated at her primary care physician office on Friday and started on antibiotics.  States that yesterday she started to have significant diarrhea and was unable to take her antibiotics.  Complaining of epigastric abdominal pain with nausea.  Endorses watery diarrhea that does not have any blood in it.  No history of C. difficile.  Diffuse vague abdominal pain with no focal pain.  Endorses compliance with her Eliquis no history of DVT or PE.  No recent falls or trauma.   In chart review patient had a recent visit with her primary care physician and was diagnosed with community-acquired pneumonia and started on doxycycline and Augmentin on 03/15/2024     Physical Exam   Triage Vital Signs: ED Triage Vitals [03/17/24 0905]  Encounter Vitals Group     BP      Systolic BP Percentile      Diastolic BP Percentile      Pulse      Resp      Temp      Temp src      SpO2      Weight 111 lb (50.3 kg)     Height 5\' 4"  (1.626 m)     Head Circumference      Peak Flow      Pain Score 0     Pain Loc      Pain Education      Exclude from Growth Chart     Most recent vital signs: Vitals:   03/17/24 1015 03/17/24 1030  BP: 104/79 113/87  Pulse: (!) 119 100  Resp:    Temp:    SpO2: (!) 83% 100%    Physical Exam Constitutional:      Appearance: She is well-developed.  HENT:     Head: Atraumatic.  Eyes:     Conjunctiva/sclera: Conjunctivae normal.  Cardiovascular:     Rate and Rhythm: Regular rhythm. Tachycardia  present.  Pulmonary:     Effort: Tachypnea present. No respiratory distress.     Breath sounds: Examination of the right-lower field reveals rhonchi. Rhonchi present.  Chest:     Chest wall: No tenderness.  Abdominal:     General: There is no distension.  Musculoskeletal:        General: Normal range of motion.     Cervical back: Normal range of motion.     Right lower leg: No edema.     Left lower leg: No edema.  Skin:    General: Skin is warm and dry.     Capillary Refill: Capillary refill takes less than 2 seconds.  Neurological:     General: No focal deficit present.     Mental Status: She is alert. Mental status is at baseline.     IMPRESSION / MDM / ASSESSMENT AND PLAN / ED COURSE  I reviewed the triage vital signs and the nursing notes.  On arrival patient significantly tachycardic with a heart rate of 114.  Does have appear to have P waves  present.  Does have history of atrial fibrillation but this appears regular.  Afebrile  Differential diagnosis including sepsis, pneumonia, ACS, heart failure, C. difficile, dehydration, electrolyte abnormality  Blood cultures obtained.  Given 1 L bolus.  Started on antibiotics with IV Rocephin.  Given her active nausea at this time we will hold off on giving her doxycycline.  Will hold on azithromycin given her erythromycin allergy.  Chart review no history of MRSA or pseudomonal risk factors  EKG  I, Viviano Ground, the attending physician, personally viewed and interpreted this ECG.  Sinus tachycardia with a heart rate of 143, appears regular.  Significant ST elevation isolated to V3.  Questionable elevation in V2.  No significant ST depression.  Difficult to see if there is ST elevations to lead III.  Significant change when compared to prior EKG  Sinus tachycardia while on cardiac telemetry.  RADIOLOGY I independently reviewed imaging, my interpretation of imaging: Chest x-ray -right middle lobe pneumonia  LABS (all labs  ordered are listed, but only abnormal results are displayed) Labs interpreted as -    Labs Reviewed  LACTIC ACID, PLASMA - Abnormal; Notable for the following components:      Result Value   Lactic Acid, Venous 3.7 (*)    All other components within normal limits  COMPREHENSIVE METABOLIC PANEL WITH GFR - Abnormal; Notable for the following components:   Glucose, Bld 100 (*)    BUN 34 (*)    Creatinine, Ser 1.27 (*)    Albumin 3.2 (*)    GFR, Estimated 43 (*)    All other components within normal limits  CBC WITH DIFFERENTIAL/PLATELET - Abnormal; Notable for the following components:   RBC 5.61 (*)    Hemoglobin 17.0 (*)    HCT 53.9 (*)    Monocytes Absolute 1.1 (*)    All other components within normal limits  PROTIME-INR - Abnormal; Notable for the following components:   Prothrombin Time 15.6 (*)    All other components within normal limits  BRAIN NATRIURETIC PEPTIDE - Abnormal; Notable for the following components:   B Natriuretic Peptide 500.8 (*)    All other components within normal limits  TROPONIN I (HIGH SENSITIVITY) - Abnormal; Notable for the following components:   Troponin I (High Sensitivity) 27 (*)    All other components within normal limits  RESP PANEL BY RT-PCR (RSV, FLU A&B, COVID)  RVPGX2  CULTURE, BLOOD (ROUTINE X 2)  CULTURE, BLOOD (ROUTINE X 2)  C DIFFICILE QUICK SCREEN W PCR REFLEX    LIPASE, BLOOD  MAGNESIUM  LACTIC ACID, PLASMA  URINALYSIS, W/ REFLEX TO CULTURE (INFECTION SUSPECTED)  TROPONIN I (HIGH SENSITIVITY)     MDM   Clinical Course as of 03/17/24 1050  Sun Mar 17, 2024  0949 Repeat blood pressure significantly low with 80 systolic.  Will order 30 cc/kg of IV fluids.  No obvious signs of heart failure.  Repeating EKG, initial EKG was reading as a STEMI however has significantly low blood pressure.  Denies any significant chest pain other than coughing at this time.  Is complaining of epigastric pain so troponin is currently pending.   Given her hypotension we will given a bolus of 30 cc/kg of IV fluids (1.5 would be her 30 cc, given 2 L. [SM]  1038 On reevaluation significant improvement of her blood pressure after 1 L of IV fluids.  Lactic acid elevated at 3.7.  Troponin mildly elevated at 27, likely demand ischemia in the setting of hypotension.  Improvement of her tachycardia and appears to have sinus tachycardia on cardiac telemetry.  Consulted hospitalist for admission for sepsis secondary to community-acquired pneumonia. [SM]    Clinical Course User Index [SM] Viviano Ground, MD     PROCEDURES:  Critical Care performed: yes  .Critical Care  Performed by: Viviano Ground, MD Authorized by: Viviano Ground, MD   Critical care provider statement:    Critical care time (minutes):  30   Critical care was necessary to treat or prevent imminent or life-threatening deterioration of the following conditions:  Sepsis and circulatory failure   Critical care was time spent personally by me on the following activities:  Development of treatment plan with patient or surrogate, discussions with consultants, evaluation of patient's response to treatment, examination of patient, ordering and review of laboratory studies, ordering and review of radiographic studies, ordering and performing treatments and interventions, pulse oximetry, re-evaluation of patient's condition and review of old charts   Care discussed with: admitting provider     Patient's presentation is most consistent with acute presentation with potential threat to life or bodily function.   MEDICATIONS ORDERED IN ED: Medications  lactated ringers  infusion ( Intravenous New Bag/Given 03/17/24 1007)  sodium chloride  0.9 % bolus 1,000 mL (1,000 mLs Intravenous New Bag/Given 03/17/24 0949)  cefTRIAXone (ROCEPHIN) 2 g in sodium chloride  0.9 % 100 mL IVPB (0 g Intravenous Stopped 03/17/24 1042)  ondansetron  (ZOFRAN ) injection 4 mg (4 mg Intravenous Given 03/17/24 0952)   lactated ringers  bolus 1,000 mL (1,000 mLs Intravenous New Bag/Given 03/17/24 1002)    FINAL CLINICAL IMPRESSION(S) / ED DIAGNOSES   Final diagnoses:  None     Rx / DC Orders   ED Discharge Orders     None        Note:  This document was prepared using Dragon voice recognition software and may include unintentional dictation errors.   Viviano Ground, MD 03/17/24 1610    Viviano Ground, MD 03/17/24 1039    Viviano Ground, MD 03/17/24 1050

## 2024-03-17 NOTE — ED Triage Notes (Signed)
 Pt states SOB since Thurs, pt DX'ed with PNA and given 2 meds. Pt states diarrhea since ABX. Pt states stool is watery. NAD noted at this time. Pt denies pain.

## 2024-03-17 NOTE — Sepsis Progress Note (Signed)
 Elink following code sepsis

## 2024-03-17 NOTE — Progress Notes (Signed)
 CODE SEPSIS - PHARMACY COMMUNICATION  **Broad Spectrum Antibiotics should be administered within 1 hour of Sepsis diagnosis**  Time Code Sepsis Called/Page Received: 5/18 @ 1919  Antibiotics Ordered:  Ceftriaxone 2g IV x 1  Time of 1st antibiotic administration: 5/18 @ 0952  Additional action taken by pharmacy: NA  If necessary, Name of Provider/Nurse Contacted: NA  Craven Do, PharmD Pharmacy Resident  03/17/2024 9:50 AM

## 2024-03-17 NOTE — H&P (Addendum)
 History and Physical    Gina Mcbride ZOX:[id number] DOB: 1945/11/28 DOA: 03/17/2024  PCP: Jonah Negus, NP (Confirm with patient/family/NH records and if not entered, this has to be entered at [facility] point of entry) Patient coming from: Home  I have personally briefly reviewed patient's old medical records in [facility]  Chief Complaint: Feeling worse  HPI: Gina Mcbride is a 78 y.o. female with medical history significant of PAF on Eliquis, HTN, COPD, presented with worsening of cough shortness of breath and diarrhea.  Patient started develop productive cough 4 to 5 days ago associated with shortness of breath and chills.  Started on antibiotics of Augmentin and doxycycline on Friday, however after taking 2 doses of both antibiotics patient started to develop nausea vomiting and watery diarrhea.  She stopped taking low-dose 2 antibiotics Friday night and Saturday she continued to experience nauseous vomiting poor appetite and ongoing diarrhea watery, 5-6 times yesterday, " every time after I ate or drink something" but denied any abdominal pain.  She has been feeling episodic chills and subjective fever.  No history of recent pneumonia and denies any cough or choking after eat or drink. ED Course: Afebrile, tachycardia heart rate in the 120-110s, borderline hypotension, O2 saturation 86% on room air.  Chest x-ray showed right middle lobe infiltrates.  Blood work showed WBC 5.6 hemoglobin 17, BUN 34 creatinine 1.2 lactic acid 3.7.  Troponin negative x 1.  EKG showed no acute ST changes.  Patient was given IV bolus x 2 and started on ceftriaxone.  Review of Systems: As per HPI otherwise 14 point review of systems negative.    Past Medical History:  Diagnosis Date   A-fib (HCC)    Asthma    Barlow's syndrome    COPD (chronic obstructive pulmonary disease) (HCC)    hx - no issues now   Dysrhythmia    rare - unnamed - followed by PCP, palpatations   Family history of  adverse reaction to anesthesia    Father - severe swelling after open heart surgery   Osteoporosis    Palpitations    Raynaud's disease without gangrene    Right tennis elbow    Thrombocytosis    Vertigo    yrs ago - no issues now   Vitamin D deficiency    Wears dentures    full upper, partial lower    Past Surgical History:  Procedure Laterality Date   APPENDECTOMY     BLEPHAROPLASTY     CATARACT EXTRACTION W/PHACO Right 07/01/2015   Procedure: CATARACT EXTRACTION PHACO AND INTRAOCULAR LENS PLACEMENT (IOC);  Surgeon: Annell Kidney, MD;  Location: [facility];  Service: Ophthalmology;  Laterality: Right;   CATARACT EXTRACTION W/PHACO Left 11/15/2017   Procedure: CATARACT EXTRACTION PHACO AND INTRAOCULAR LENS PLACEMENT (IOC) LEFT;  Surgeon: Annell Kidney, MD;  Location: [facility];  Service: Ophthalmology;  Laterality: Left;   COLONOSCOPY     COLONOSCOPY WITH PROPOFOL  N/A 06/27/2018   Procedure: COLONOSCOPY WITH PROPOFOL ;  Surgeon: Toledo, Alphonsus Jeans, MD;  Location: [facility];  Service: Gastroenterology;  Laterality: N/A;   ESOPHAGOGASTRODUODENOSCOPY (EGD) WITH PROPOFOL  N/A 06/27/2018   Procedure: ESOPHAGOGASTRODUODENOSCOPY (EGD) WITH PROPOFOL ;  Surgeon: Toledo, Alphonsus Jeans, MD;  Location: [facility];  Service: Gastroenterology;  Laterality: N/A;   TONSILLECTOMY     TUBAL LIGATION       reports that she quit smoking about 29 years ago. Her smoking use included cigarettes. She started smoking about 54 years ago. She has  a 25 pack-year smoking history. She has never used smokeless tobacco. She reports that she does not drink alcohol and does not use drugs.  Allergies  Allergen Reactions   Erythromycin Base    Other Swelling    Hand contact with raw shrimp   Oxytetracycline Other (See Comments)    Mouth blisters (all -mycins) Mouth blisters (all -mycins)   Shrimp [Shellfish Allergy] Swelling    Hand contact with raw shrimp    Family History   Problem Relation Age of Onset   Breast cancer Sister 60     Prior to Admission medications   Medication Sig Start Date End Date Taking? Authorizing Provider  amoxicillin-clavulanate (AUGMENTIN) 875-125 MG tablet Take 1 tablet by mouth every 12 (twelve) hours. 03/15/24  Yes [provider]  apixaban (ELIQUIS) 5 MG TABS tablet Take 5 mg by mouth. 12/13/22  Yes [provider]  clonazePAM (KLONOPIN) 1 MG tablet Take 1 mg by mouth at bedtime. May take 1/2 tablet during the day if needed.   Yes [provider]  denosumab (PROLIA) 60 MG/ML SOLN injection Inject 60 mg into the skin every 6 (six) months. Administer in upper arm, thigh, or abdomen   Yes [provider]  doxycycline (VIBRAMYCIN) 100 MG capsule 100 mg 2 (two) times daily. 03/15/24  Yes [provider]  metoprolol  succinate (TOPROL -XL) 25 MG 24 hr tablet Take 25 mg by mouth daily.   Yes [provider]  prednisoLONE acetate (PRED FORTE) 1 % ophthalmic suspension Place 1 drop into the left eye 2 (two) times daily.   Yes [provider]  rosuvastatin  (CRESTOR ) 5 MG tablet Take 1 tablet (5 mg total) by mouth daily. 03/30/21  Yes Gollan, Timothy J, MD  Calcium  Citrate-Vitamin D (CALCIUM  + D PO) Take by mouth. lunchtime    [provider]  celecoxib (CELEBREX) 200 MG capsule Take by mouth. Patient not taking: Reported on 03/17/2024 06/08/21   [provider]  Cholecalciferol (VITAMIN D PO) Take by mouth. lunchtime    [provider]  etodolac (LODINE) 400 MG tablet Take 400 mg by mouth daily. Patient not taking: Reported on 03/17/2024 01/01/20   [provider]  hydrocortisone 2.5 % cream Apply topically. Patient not taking: Reported on 03/17/2024 02/16/23   [provider]  hydrOXYzine (VISTARIL) 25 MG capsule Take 25 mg by mouth 2 (two) times daily. Patient not taking: Reported on 12/05/2021 10/13/21   [provider]  meclizine  (ANTIVERT) 25 MG tablet Take 25 mg by mouth every 6 (six) hours as needed. Patient not taking: Reported on 03/17/2024 09/18/23   [provider]  metoprolol  succinate (TOPROL -XL) 50 MG 24 hr tablet Take 1 tablet (50 mg total) by mouth daily. Take with or immediately following a meal. Patient not taking: Reported on 03/17/2024 03/30/21   Gollan, Timothy J, MD  traMADol [place] Sick) 50 MG tablet Take 25-50 mg by mouth. 10/19/22   [provider]    Physical Exam: Vitals:   03/17/24 1057 03/17/24 1100 03/17/24 1109 03/17/24 1115  BP: 119/79 122/72 112/74 (!) 109/93  Pulse:    (!) 44  Resp:      Temp:      TempSrc:      SpO2:    90%  Weight:      Height:        Constitutional: NAD, calm, comfortable Vitals:   03/17/24 1057 03/17/24 1100 03/17/24 1109 03/17/24 1115  BP: 119/79 122/72 112/74 (!) 109/93  Pulse:    Aaron Aas)  44  Resp:      Temp:      TempSrc:      SpO2:    90%  Weight:      Height:       Eyes: PERRL, lids and conjunctivae normal ENMT: Mucous membranes are dry. Posterior pharynx clear of any exudate or lesions.Normal dentition.  Neck: normal, supple, no masses, no thyromegaly Respiratory: clear to auscultation bilaterally, no wheezing, scattered crackles on right side, increasing respiratory effort. No accessory muscle use.  Cardiovascular: Regular rate and rhythm, no murmurs / rubs / gallops. No extremity edema. 2+ pedal pulses. No carotid bruits.  Abdomen: no tenderness, no masses palpated. No hepatosplenomegaly. Bowel sounds positive.  Musculoskeletal: no clubbing / cyanosis. No joint deformity upper and lower extremities. Good ROM, no contractures. Normal muscle tone.  Skin: no rashes, lesions, ulcers. No induration Neurologic: CN 2-12 grossly intact. Sensation intact, DTR normal. Strength 5/5 in all 4.  Psychiatric: Normal judgment and insight. Alert and oriented x 3. Normal mood.     Labs on Admission: I have personally reviewed following labs and  imaging studies  CBC: Recent Labs  Lab 03/17/24 0935  WBC 9.5  NEUTROABS 6.6  HGB 17.0*  HCT 53.9*  MCV 96.1  PLT 331   Basic Metabolic Panel: Recent Labs  Lab 03/17/24 0935  NA 138  K 4.2  CL 102  CO2 23  GLUCOSE 100*  BUN 34*  CREATININE 1.27*  CALCIUM  9.5  MG 2.3   GFR: Estimated Creatinine Clearance: 29 mL/min (A) (by C-G formula based on SCr of 1.27 mg/dL (H)). Liver Function Tests: Recent Labs  Lab 03/17/24 0935  AST 40  ALT 22  ALKPHOS 75  BILITOT 0.9  PROT 7.3  ALBUMIN 3.2*   Recent Labs  Lab 03/17/24 0935  LIPASE 51   No results for input(s): "AMMONIA" in the last 168 hours. Coagulation Profile: Recent Labs  Lab 03/17/24 0935  INR 1.2   Cardiac Enzymes: No results for input(s): "CKTOTAL", "CKMB", "CKMBINDEX", "TROPONINI" in the last 168 hours. BNP (last 3 results) No results for input(s): "PROBNP" in the last 8760 hours. HbA1C: No results for input(s): "HGBA1C" in the last 72 hours. CBG: No results for input(s): "GLUCAP" in the last 168 hours. Lipid Profile: No results for input(s): "CHOL", "HDL", "LDLCALC", "TRIG", "CHOLHDL", "LDLDIRECT" in the last 72 hours. Thyroid  Function Tests: No results for input(s): "TSH", "T4TOTAL", "FREET4", "T3FREE", "THYROIDAB" in the last 72 hours. Anemia Panel: No results for input(s): "VITAMINB12", "FOLATE", "FERRITIN", "TIBC", "IRON", "RETICCTPCT" in the last 72 hours. Urine analysis:    Component Value Date/Time   COLORURINE YELLOW 12/05/2021 1311   APPEARANCEUR CLEAR 12/05/2021 1311   APPEARANCEUR Hazy 07/16/2012 1202   LABSPEC <1.005 (L) 12/05/2021 1311   LABSPEC 1.012 07/16/2012 1202   PHURINE 5.5 12/05/2021 1311   GLUCOSEU NEGATIVE 12/05/2021 1311   GLUCOSEU Negative 07/16/2012 1202   HGBUR NEGATIVE 12/05/2021 1311   BILIRUBINUR NEGATIVE 12/05/2021 1311   BILIRUBINUR Negative 07/16/2012 1202   KETONESUR TRACE (A) 12/05/2021 1311   PROTEINUR NEGATIVE 12/05/2021 1311   NITRITE NEGATIVE  12/05/2021 1311   LEUKOCYTESUR SMALL (A) 12/05/2021 1311   LEUKOCYTESUR Trace 07/16/2012 1202    Radiological Exams on Admission: DG Chest Portable 1 View Result Date: 03/17/2024 CLINICAL DATA:  78 year old female with shortness of breath. Recently treated with pneumonia, diarrhea since starting antibiotics now. EXAM: PORTABLE CHEST 1 VIEW COMPARISON:  CT chest 08/23/2023 and earlier. FINDINGS: Portable AP upright view at 0916 hours.  Chronic large lung volumes and mild cardiomegaly are stable. Other mediastinal contours are within normal limits. Visualized tracheal air column is within normal limits. No pneumothorax, pulmonary edema, pleural effusion. Patchy and indistinct although vaguely nodular right perihilar opacity is new at the mid lung. No other acute or confluent lung opacity. Mild chronic lung base scarring. No acute osseous abnormality identified. Negative visible bowel gas. IMPRESSION: Chronic hyperinflation and cardiomegaly with patchy new right mid lung perihilar opacity since last year. Favor acute infectious exacerbation, small Bronchopneumonia. Followup PA and lateral chest X-ray is recommended in 3-4 weeks following trial of antibiotic therapy to ensure resolution and exclude underlying malignancy. Electronically Signed   By: Marlise Simpers M.D.   On: 03/17/2024 09:30    EKG: Independently reviewed.  A flutter  Assessment/Plan Principal Problem:   CAP (community acquired pneumonia)  (please populate well all problems here in Problem List. (For example, if patient is on BP meds at home and you resume or decide to hold them, it is a problem that needs to be her. Same for CAD, COPD, HLD and so on)  Acute hypoxic respiratory failure - Secondary to right middle lobe CAP bacterial - Continue antibiotics ceftriaxone and IV doxycycline - DuoNebs, guaifenesin - Incentive spirometry and flutter valve  Sepsis without acute endorgan damage - Sepsis evidenced by tachycardia and elevated lactic  acid, source of infection is right middle lobe pneumonia management as above. - Received IV bolus x 2 in the ED - Continue maintenance IV fluid  PAF with RVR - Blood pressure borderline low, will use as needed Lopressor  for rate control - Continue Eliquis -K and Mg level normal  Diarrhea - Probably related to antibiotics use, however given the acute onset of diarrhea and other GI symptoms such as nauseous vomiting right after patient taking first day antibiotics, low suspicion for C. difficile.  Will try Imodium and probiotics  COPD - No signs of acute exacerbation.  DuoNebs for shortness of breath bronchopneumonia.  Deconditioning - PT evaluation  DVT prophylaxis: Eliquis Code Status: Full code Family Communication: Husband at bedside Disposition Plan: Expect less than 2 midnight hospital stay Consults called: None Admission status: Telemetry observation   [place] MD [facility] Pager [phone]  03/17/2024, 11:38 AM

## 2024-03-17 NOTE — Evaluation (Signed)
 Physical Therapy Evaluation Patient Details Name: Gina Mcbride MRN: [id number] DOB: [birth year] Today's Date: 03/17/2024  History of Present Illness  Gina Mcbride is a 78yoF [facility] with worsening cough, shortness of breath, palpitations, diarrhea, dizziness. PMH: PAF on Eliquis, HTN, COPD,  Clinical Impression  Pt reports to feel better overall, however her vitals remains outside of normal range per review- HR remains elevated at rest, worse with sitting EOB (asymptomatic 120s) and quickly rise to 130s with coming to standing. BP very low (70s/40s) earlier in day, but WNL on my assessment and stable with upright. Pt moving well from a strength standpoint and is less symptomatic with upright, however vitals are not stable enough to allow for AMB assessment. Anticipate minimal needs at DC, however will defer full mobility until next session.       If plan is discharge home, recommend the following:     Can travel by private vehicle        Equipment Recommendations None recommended by PT  Recommendations for Other Services       Functional Status Assessment Patient has had a recent decline in their functional status and demonstrates the ability to make significant improvements in function in a reasonable and predictable amount of time.     Precautions / Restrictions Precautions Precautions: Fall Restrictions Weight Bearing Restrictions Per Provider Order: No      Mobility  Bed Mobility Overal bed mobility: Independent                  Transfers Overall transfer level: Needs assistance Equipment used: 1 person hand held assist Transfers: Sit to/from Stand Sit to Stand: Modified independent (Device/Increase time)           General transfer comment: able to step up in the bed, but HR elevates again, hence, we decide to return to sitting EOB.    Ambulation/Gait Ambulation/Gait assistance:  (deferred, vitals not appropriate for additional  exertion at this time.)                Stairs            Wheelchair Mobility     Tilt Bed    Modified Rankin (Stroke Patients Only)       Balance Overall balance assessment: Modified Independent                                           Pertinent Vitals/Pain Pain Assessment Pain Assessment: No/denies pain    Home Living Family/patient expects to be discharged to:: Private residence Living Arrangements: Spouse/significant other Available Help at Discharge: Family Type of Home: House Home Access: (P) Stairs to enter   Entrance Stairs-Number of Steps: 1 step from carport   Home Layout: One level Home Equipment: None      Prior Function Prior Level of Function : Independent/Modified Independent;Driving             Mobility Comments: ad lib AMB, no falls or balance history ADLs Comments: independent     Extremity/Trunk Assessment                Communication        Cognition Arousal: Alert Behavior During Therapy: WFL for tasks assessed/performed   PT - Cognitive impairments: No apparent impairments  Cueing       General Comments      Exercises     Assessment/Plan    PT Assessment Patient needs continued PT services  PT Problem List Decreased activity tolerance;Decreased mobility;Cardiopulmonary status limiting activity       PT Treatment Interventions Functional mobility training;Therapeutic activities;Therapeutic exercise    PT Goals (Current goals can be found in the Care Plan section)  Acute Rehab PT Goals Patient Stated Goal: regain her baseline independence PT Goal Formulation: With patient Time For Goal Achievement: 03/31/24 Potential to Achieve Goals: Good    Frequency Min 2X/week     Co-evaluation               AM-PAC PT "6 Clicks" Mobility  Outcome Measure Help needed turning from your back to your side while in a flat bed without  using bedrails?: None Help needed moving from lying on your back to sitting on the side of a flat bed without using bedrails?: None Help needed moving to and from a bed to a chair (including a wheelchair)?: None Help needed standing up from a chair using your arms (e.g., wheelchair or bedside chair)?: None Help needed to walk in hospital room?: A Little Help needed climbing 3-5 steps with a railing? : A Little 6 Click Score: 22    End of Session   Activity Tolerance: Patient tolerated treatment well;No increased pain;Treatment limited secondary to medical complications (Comment) Patient left: in bed;with family/visitor present;with nursing/sitter in room;with call bell/phone within reach Nurse Communication: Mobility status PT Visit Diagnosis: Difficulty in walking, not elsewhere classified (R26.2);Other abnormalities of gait and mobility (R26.89)    Time: 1534-1550 PT Time Calculation (min) (ACUTE ONLY): 16 min   Charges:   PT Evaluation $PT Eval Low Complexity: 1 Low   PT General Charges $$ ACUTE PT VISIT: 1 Visit       4:09 PM, 03/17/24 Dawn Eth, PT, DPT Physical Therapist - [facility]  [phone] (ASCOM)    Gina Mcbride C 03/17/2024, 4:05 PM

## 2024-03-17 NOTE — Care Management Obs Status (Signed)
 MEDICARE OBSERVATION STATUS NOTIFICATION   Patient Details  Name: Gina Mcbride MRN: [id number] Date of Birth: 1945-12-28   Medicare Observation Status Notification Given:  Yes    [place] L Iara Monds, LCSW 03/17/2024, 3:54 PM

## 2024-03-18 DIAGNOSIS — E872 Acidosis, unspecified: Secondary | ICD-10-CM | POA: Diagnosis present

## 2024-03-18 DIAGNOSIS — R531 Weakness: Secondary | ICD-10-CM

## 2024-03-18 DIAGNOSIS — J439 Emphysema, unspecified: Secondary | ICD-10-CM | POA: Diagnosis present

## 2024-03-18 DIAGNOSIS — J181 Lobar pneumonia, unspecified organism: Secondary | ICD-10-CM | POA: Diagnosis present

## 2024-03-18 DIAGNOSIS — A419 Sepsis, unspecified organism: Secondary | ICD-10-CM | POA: Diagnosis present

## 2024-03-18 DIAGNOSIS — Z7901 Long term (current) use of anticoagulants: Secondary | ICD-10-CM | POA: Diagnosis not present

## 2024-03-18 DIAGNOSIS — I480 Paroxysmal atrial fibrillation: Secondary | ICD-10-CM | POA: Diagnosis present

## 2024-03-18 DIAGNOSIS — T360X5A Adverse effect of penicillins, initial encounter: Secondary | ICD-10-CM | POA: Diagnosis present

## 2024-03-18 DIAGNOSIS — R6520 Severe sepsis without septic shock: Secondary | ICD-10-CM

## 2024-03-18 DIAGNOSIS — J440 Chronic obstructive pulmonary disease with acute lower respiratory infection: Secondary | ICD-10-CM | POA: Diagnosis present

## 2024-03-18 DIAGNOSIS — Z87891 Personal history of nicotine dependence: Secondary | ICD-10-CM | POA: Diagnosis not present

## 2024-03-18 DIAGNOSIS — Z803 Family history of malignant neoplasm of breast: Secondary | ICD-10-CM | POA: Diagnosis not present

## 2024-03-18 DIAGNOSIS — I6529 Occlusion and stenosis of unspecified carotid artery: Secondary | ICD-10-CM | POA: Diagnosis present

## 2024-03-18 DIAGNOSIS — J189 Pneumonia, unspecified organism: Secondary | ICD-10-CM | POA: Diagnosis present

## 2024-03-18 DIAGNOSIS — J9601 Acute respiratory failure with hypoxia: Secondary | ICD-10-CM | POA: Diagnosis present

## 2024-03-18 DIAGNOSIS — E559 Vitamin D deficiency, unspecified: Secondary | ICD-10-CM | POA: Diagnosis present

## 2024-03-18 DIAGNOSIS — Z1152 Encounter for screening for COVID-19: Secondary | ICD-10-CM | POA: Diagnosis not present

## 2024-03-18 DIAGNOSIS — T364X5A Adverse effect of tetracyclines, initial encounter: Secondary | ICD-10-CM | POA: Diagnosis present

## 2024-03-18 DIAGNOSIS — I7300 Raynaud's syndrome without gangrene: Secondary | ICD-10-CM | POA: Diagnosis present

## 2024-03-18 DIAGNOSIS — K521 Toxic gastroenteritis and colitis: Secondary | ICD-10-CM | POA: Diagnosis present

## 2024-03-18 DIAGNOSIS — M810 Age-related osteoporosis without current pathological fracture: Secondary | ICD-10-CM | POA: Diagnosis present

## 2024-03-18 DIAGNOSIS — T361X5A Adverse effect of cephalosporins and other beta-lactam antibiotics, initial encounter: Secondary | ICD-10-CM | POA: Diagnosis present

## 2024-03-18 DIAGNOSIS — J180 Bronchopneumonia, unspecified organism: Secondary | ICD-10-CM | POA: Diagnosis present

## 2024-03-18 DIAGNOSIS — I10 Essential (primary) hypertension: Secondary | ICD-10-CM | POA: Diagnosis present

## 2024-03-18 DIAGNOSIS — Z961 Presence of intraocular lens: Secondary | ICD-10-CM | POA: Diagnosis present

## 2024-03-18 DIAGNOSIS — Z23 Encounter for immunization: Secondary | ICD-10-CM | POA: Diagnosis present

## 2024-03-18 LAB — BASIC METABOLIC PANEL WITH GFR
Anion gap: 6 (ref 5–15)
BUN: 36 mg/dL — ABNORMAL HIGH (ref 8–23)
CO2: 22 mmol/L (ref 22–32)
Calcium: 8.4 mg/dL — ABNORMAL LOW (ref 8.9–10.3)
Chloride: 109 mmol/L (ref 98–111)
Creatinine, Ser: 1.05 mg/dL — ABNORMAL HIGH (ref 0.44–1.00)
GFR, Estimated: 54 mL/min — ABNORMAL LOW (ref >60)
Glucose, Bld: 89 mg/dL (ref 70–99)
Potassium: 3.7 mmol/L (ref 3.5–5.1)
Sodium: 137 mmol/L (ref 135–145)

## 2024-03-18 LAB — CBC
HCT: 40.3 % (ref 36.0–46.0)
Hemoglobin: 12.7 g/dL (ref 12.0–15.0)
MCH: 30.6 pg (ref 26.0–34.0)
MCHC: 31.5 g/dL (ref 30.0–36.0)
MCV: 97.1 fL (ref 80.0–100.0)
Platelets: 246 10*3/uL (ref 150–400)
RBC: 4.15 MIL/uL (ref 3.87–5.11)
RDW: 13.8 % (ref 11.5–15.5)
WBC: 7 10*3/uL (ref 4.0–10.5)
nRBC: 0 % (ref 0.0–0.2)

## 2024-03-18 LAB — STREP PNEUMONIAE URINARY ANTIGEN: Strep Pneumo Urinary Antigen: NEGATIVE

## 2024-03-18 MED ORDER — ENSURE ENLIVE PO LIQD
237.0000 mL | Freq: Two times a day (BID) | ORAL | Status: DC
Start: 2024-03-18 — End: 2024-03-20
  Administered 2024-03-18 – 2024-03-19 (×2): 237 mL via ORAL

## 2024-03-18 MED ORDER — DOXYCYCLINE HYCLATE 100 MG PO TABS
100.0000 mg | ORAL_TABLET | Freq: Two times a day (BID) | ORAL | Status: DC
Start: 2024-03-18 — End: 2024-03-20
  Administered 2024-03-18 – 2024-03-20 (×4): 100 mg via ORAL
  Filled 2024-03-18 (×4): qty 1

## 2024-03-18 MED ORDER — AMIODARONE HCL 200 MG PO TABS
200.0000 mg | ORAL_TABLET | Freq: Two times a day (BID) | ORAL | Status: DC
  Administered 2024-03-18 – 2024-03-19 (×3): 200 mg via ORAL
  Filled 2024-03-18 (×3): qty 1

## 2024-03-18 MED ORDER — ACETAMINOPHEN 325 MG PO TABS
650.0000 mg | ORAL_TABLET | ORAL | Status: DC | PRN
  Administered 2024-03-18: 650 mg via ORAL
  Filled 2024-03-18: qty 2

## 2024-03-18 NOTE — Plan of Care (Signed)
  Problem: Clinical Measurements: Goal: Cardiovascular complication will be avoided Outcome: Progressing   Problem: Activity: Goal: Risk for activity intolerance will decrease Outcome: Progressing   Problem: Nutrition: Goal: Adequate nutrition will be maintained Outcome: Progressing   

## 2024-03-18 NOTE — Progress Notes (Signed)
 [facility]  - [place] [facility]   PATIENT NAME: Gina Mcbride    MR#:  [id number]  DATE OF BIRTH:  1946/01/11  SUBJECTIVE:      VITALS:  Blood pressure 110/71, pulse 69, temperature 97.7 F (36.5 C), resp. rate 18, height 5\' 4"  (1.626 m), weight 50.3 kg, SpO2 96%.  PHYSICAL EXAMINATION:   GENERAL:  78 y.o.-year-old patient with no acute distress.  LUNGS: Normal breath sounds bilaterally, no wheezing CARDIOVASCULAR: S1, S2 normal. No murmur   ABDOMEN: Soft, nontender, nondistended. Bowel sounds present.  EXTREMITIES: No  edema b/l.    NEUROLOGIC: nonfocal  patient is alert and awake SKIN: No obvious rash, lesion, or ulcer.   LABORATORY PANEL:  CBC Recent Labs  Lab 03/18/24 0142  WBC 7.0  HGB 12.7  HCT 40.3  PLT 246    Chemistries  Recent Labs  Lab 03/17/24 0935 03/18/24 0142  NA 138 137  K 4.2 3.7  CL 102 109  CO2 23 22  GLUCOSE 100* 89  BUN 34* 36*  CREATININE 1.27* 1.05*  CALCIUM  9.5 8.4*  MG 2.3  --   AST 40  --   ALT 22  --   ALKPHOS 75  --   BILITOT 0.9  --    Cardiac Enzymes No results for input(s): "TROPONINI" in the last 168 hours. RADIOLOGY:  DG Chest Portable 1 View Result Date: 03/17/2024 CLINICAL DATA:  78 year old female with shortness of breath. Recently treated with pneumonia, diarrhea since starting antibiotics now. EXAM: PORTABLE CHEST 1 VIEW COMPARISON:  CT chest 08/23/2023 and earlier. FINDINGS: Portable AP upright view at 0916 hours. Chronic large lung volumes and mild cardiomegaly are stable. Other mediastinal contours are within normal limits. Visualized tracheal air column is within normal limits. No pneumothorax, pulmonary edema, pleural effusion. Patchy and indistinct although vaguely nodular right perihilar opacity is new at the mid lung. No other acute or confluent lung opacity. Mild chronic lung base scarring. No acute osseous abnormality identified. Negative visible bowel gas. IMPRESSION: Chronic  hyperinflation and cardiomegaly with patchy new right mid lung perihilar opacity since last year. Favor acute infectious exacerbation, small Bronchopneumonia. Followup PA and lateral chest X-ray is recommended in 3-4 weeks following trial of antibiotic therapy to ensure resolution and exclude underlying malignancy. Electronically Signed   By: Marlise Simpers M.D.   On: 03/17/2024 09:30    Assessment and Plan  Gina Mcbride is a 78 y.o. female with medical history significant of PAF on Eliquis , HTN, COPD, presented with worsening of cough shortness of breath and diarrhea.   Patient started develop productive cough 4 to 5 days ago associated with shortness of breath and chills.  Started on antibiotics of Augmentin and doxycycline  on Friday, however after taking 2 doses of both antibiotics patient started to develop nausea vomiting and watery diarrhea.    Chest x-ray showed right middle lobe infiltrates.   Sepsis due to community acquired pneumonia right middle lobe -- patient came in with cough congestion and saw PCP started on amoxicillin and doxycycline  took few doses and started having diarrhea. -- Continue IV Rocephin  and doxycycline  -- nebulizer, insider spirometer and flutter valve -- monitor fever curve -- lactic acid 2.7-- 1.4 -- received IV fluids  Paroxysmal a fib with RVR acute on chronic in the setting of infection/sepsis/diarrhea -- heartrate anywhere from 108-130's -- increases with activity -- patient follows with [facility] cardiology-- per outpatient notes amiodarone  was discontinued in October 2024 due to heartrate being in  the 50s to 60s -- since patient rate is still up will resume amiodarone  200 BID for now and change to daily dose after seven days -- continue low-dose Toprol -XL -- cardiology consultation if no improvement      Diarrhea -- suspect antibiotic related. Patient not have diarrhea since she came to the hospital.  COPD/emphysema/remote h/o smoking -- sats stable on  room air -- nebs/inhaler as needed  Generalized weakness in the setting of diarrhea and pneumonia -- patient encouraged to eat and drink fluids -- worked with PT recommends no PT need  Procedures: Family communication :none today Consults :none CODE STATUS: full DVT Prophylaxis :eliquis  Level of care: Telemetry Medical Status is: Inpatient Remains inpatient appropriate because: PNA, Afib    TOTAL TIME TAKING CARE OF THIS PATIENT: 45 minutes.  >50% time spent on counselling and coordination of care  Note: This dictation was prepared with Dragon dictation along with smaller phrase technology. Any transcriptional errors that result from this process are unintentional.  Melvinia Stager M.D    [facility]   CC: Primary care physician; Jonah Negus, NP

## 2024-03-18 NOTE — Plan of Care (Signed)

## 2024-03-18 NOTE — Progress Notes (Signed)
 Physical Therapy Treatment Patient Details Name: Gina Mcbride MRN: [id number] DOB: 1946-03-29 Today's Date: 03/18/2024   History of Present Illness Gina Mcbride is a 78yoF comes to [facility] on 03/17/24 with worsening cough, shortness of breath, palpitations, diarrhea, dizziness. PMH: PAF on Eliquis , HTN, COPD,    PT Comments  Pt received supine in bed agreeable to PT. HR remains elevated. Trended as low as 107 BPM at rest but on average, her HR is 118-140 BPM. Does gradually elevate with progressive mobility. Pt is independent with bed mobility, STS, and toileting in bathroom. Pt is able to ambulate at supervision level in room with elevation in HR to upper 130's to 140 BPM. With seated rest HR does decrease to 120's. Reviewed energy conservation/graded mobility in the setting of tachycardia. Also time spent discussing importance of flutter valve use and incentive spirometry and at least getting to chair for pulmonary function due to barriers of ambulation/mobility due to her HR. Pt understanding with all needs in reach. Will continue to maintain on caseload to monitor safe HR with mobility to prevent deconditioning.    If plan is discharge home, recommend the following: Help with stairs or ramp for entrance   Can travel by private vehicle        Equipment Recommendations  None recommended by PT    Recommendations for Other Services       Precautions / Restrictions Precautions Precautions: Fall Precaution/Restrictions Comments: monitor HR     Mobility  Bed Mobility Overal bed mobility: Independent               Patient Response: Cooperative  Transfers Overall transfer level: Needs assistance Equipment used: None Transfers: Sit to/from Stand Sit to Stand: Independent                Ambulation/Gait Ambulation/Gait assistance: Supervision Gait Distance (Feet): 30 Feet Assistive device: None Gait Pattern/deviations: Step-through pattern, Decreased step length -  right, Decreased step length - left       General Gait Details: deferring gait distances due to tachycardia   Stairs             Wheelchair Mobility     Tilt Bed Tilt Bed Patient Response: Cooperative  Modified Rankin (Stroke Patients Only)       Balance Overall balance assessment: Mild deficits observed, not formally tested                                          Communication Communication Communication: No apparent difficulties  Cognition Arousal: Alert Behavior During Therapy: WFL for tasks assessed/performed   PT - Cognitive impairments: No apparent impairments                                Cueing    Exercises Other Exercises Other Exercises: Reviewed purposes and use of flutter valve and incentive spirometry. Importance of pulmonary toileting and graded mobility pending tachycardia.    General Comments General comments (skin integrity, edema, etc.): HR ranging from 118 BPM to 138 BPM throughout session      Pertinent Vitals/Pain Pain Assessment Pain Assessment: No/denies pain    Home Living                          Prior Function  PT Goals (current goals can now be found in the care plan section) Acute Rehab PT Goals Patient Stated Goal: regain her baseline independence PT Goal Formulation: With patient Time For Goal Achievement: 03/31/24 Potential to Achieve Goals: Good Progress towards PT goals: Progressing toward goals    Frequency    Min 2X/week      PT Plan      Co-evaluation              AM-PAC PT "6 Clicks" Mobility   Outcome Measure  Help needed turning from your back to your side while in a flat bed without using bedrails?: None Help needed moving from lying on your back to sitting on the side of a flat bed without using bedrails?: None Help needed moving to and from a bed to a chair (including a wheelchair)?: None Help needed standing up from a chair using  your arms (e.g., wheelchair or bedside chair)?: None Help needed to walk in hospital room?: A Little Help needed climbing 3-5 steps with a railing? : A Little 6 Click Score: 22    End of Session   Activity Tolerance: Patient tolerated treatment well;Treatment limited secondary to medical complications (Comment) (HR) Patient left: in chair;with call bell/phone within reach;with family/visitor present Nurse Communication: Mobility status PT Visit Diagnosis: Difficulty in walking, not elsewhere classified (R26.2);Other abnormalities of gait and mobility (R26.89)     Time: 9147-8295 PT Time Calculation (min) (ACUTE ONLY): 24 min  Charges:    $Therapeutic Activity: 8-22 mins $Self Care/Home Management: 8-22 PT General Charges $$ ACUTE PT VISIT: 1 Visit                     Marc Senior. Fairly IV, PT, DPT Physical Therapist- [place]  [facility]  03/18/2024, 12:33 PM

## 2024-03-19 ENCOUNTER — Encounter (INDEPENDENT_AMBULATORY_CARE_PROVIDER_SITE_OTHER): Payer: Self-pay

## 2024-03-19 DIAGNOSIS — J189 Pneumonia, unspecified organism: Secondary | ICD-10-CM | POA: Diagnosis not present

## 2024-03-19 DIAGNOSIS — I480 Paroxysmal atrial fibrillation: Secondary | ICD-10-CM | POA: Diagnosis not present

## 2024-03-19 DIAGNOSIS — R531 Weakness: Secondary | ICD-10-CM | POA: Diagnosis not present

## 2024-03-19 DIAGNOSIS — A419 Sepsis, unspecified organism: Secondary | ICD-10-CM | POA: Diagnosis not present

## 2024-03-19 LAB — URINE CULTURE: Culture: NO GROWTH

## 2024-03-19 MED ORDER — CEFUROXIME AXETIL 500 MG PO TABS
500.0000 mg | ORAL_TABLET | Freq: Two times a day (BID) | ORAL | Status: DC
  Administered 2024-03-20: 500 mg via ORAL
  Filled 2024-03-19 (×2): qty 1

## 2024-03-19 MED ORDER — AMIODARONE HCL 200 MG PO TABS: 200.0000 mg | ORAL_TABLET | Freq: Every day | ORAL | Status: DC

## 2024-03-19 MED ORDER — AMIODARONE HCL 200 MG PO TABS
200.0000 mg | ORAL_TABLET | Freq: Two times a day (BID) | ORAL | Status: DC
  Administered 2024-03-19 – 2024-03-20 (×2): 200 mg via ORAL
  Filled 2024-03-19 (×2): qty 1

## 2024-03-19 NOTE — Plan of Care (Signed)
  Problem: Education: Goal: Knowledge of General Education information will improve Description: Including pain rating scale, medication(s)/side effects and non-pharmacologic comfort measures Outcome: Progressing   Problem: Clinical Measurements: Goal: Cardiovascular complication will be avoided Outcome: Progressing   Problem: Nutrition: Goal: Adequate nutrition will be maintained Outcome: Progressing   Problem: Pain Managment: Goal: General experience of comfort will improve and/or be controlled Outcome: Progressing

## 2024-03-19 NOTE — Progress Notes (Signed)
 [facility]  - [place] [facility]   PATIENT NAME: Nerea Bordenave    MR#:  [id number]  DATE OF BIRTH:  04/15/1946  SUBJECTIVE:  Husband at bedside patient reports coughing up phlegm. Walked with mobility therapist today around the nurses station. Heartrate every now and then jumps into 130. During my evaluation 90-100 overall patient seems she is improving a little bit. No more diarrhea. 9    VITALS:  Blood pressure 100/67, pulse 98, temperature 98 F (36.7 C), temperature source Oral, resp. rate 18, height 5\' 4"  (1.626 m), weight 50.3 kg, SpO2 95%.  PHYSICAL EXAMINATION:   GENERAL:  78 y.o.-year-old patient with no acute distress. weak LUNGS: Distant  breath sounds bilaterally, no wheezing CARDIOVASCULAR: S1, S2 normal. No murmur  tachycardia ABDOMEN: Soft, nontender, nondistended. Bowel sounds present.  EXTREMITIES: No  edema b/l.    NEUROLOGIC: nonfocal  patient is alert and awake  LABORATORY PANEL:  CBC Recent Labs  Lab 03/18/24 0142  WBC 7.0  HGB 12.7  HCT 40.3  PLT 246    Chemistries  Recent Labs  Lab 03/17/24 0935 03/18/24 0142  NA 138 137  K 4.2 3.7  CL 102 109  CO2 23 22  GLUCOSE 100* 89  BUN 34* 36*  CREATININE 1.27* 1.05*  CALCIUM  9.5 8.4*  MG 2.3  --   AST 40  --   ALT 22  --   ALKPHOS 75  --   BILITOT 0.9  --    Cardiac Enzymes No results for input(s): "TROPONINI" in the last 168 hours. RADIOLOGY:  No results found.   Assessment and Plan  JENIPHER HAVEL is a 78 y.o. female with medical history significant of PAF on Eliquis , HTN, COPD, presented with worsening of cough shortness of breath and diarrhea.   Patient started develop productive cough 4 to 5 days ago associated with shortness of breath and chills.  Started on antibiotics of Augmentin and doxycycline  on Friday, however after taking 2 doses of both antibiotics patient started to develop nausea vomiting and watery diarrhea.    Chest x-ray showed right middle  lobe infiltrates.   Sepsis due to community acquired pneumonia right middle lobe -- patient came in with cough congestion and saw PCP started on amoxicillin and doxycycline  took few doses and started having diarrhea. -- Continue IV Rocephin  and doxycycline --change to oral from Am -- nebulizer, insider spirometer and flutter valve -- monitor fever curve -- lactic acid 2.7-- 1.4 -- received IV fluids  Paroxysmal a fib with RVR acute on chronic in the setting of infection/sepsis/diarrhea -- heartrate anywhere from 108-130's -- increases with activity -- patient [facility] with [facility] cardiology-- per outpatient notes amiodarone  was discontinued in October 2024 due to heart rate being in the 50s to 60s -- since patient rate is still up will resume amiodarone  200 BID for now and change to daily dose after seven days -- continue low-dose Toprol -XL -- cardiology consultation if no improvement     --pt will f/u with Dr Beau Bound as out pt-- she is aware  Diarrhea -- suspect antibiotic related. Patient not have diarrhea since she came to the hospital.  COPD/emphysema/remote h/o smoking -- sats stable on room air -- nebs/inhaler as needed  Generalized weakness in the setting of diarrhea and pneumonia -- patient encouraged to eat and drink fluids -- worked with PT recommends no PT need   Family communication :Husband Consults :none CODE STATUS: full DVT Prophylaxis :eliquis  Level of care: Telemetry Medical Status  is: Inpatient Remains inpatient appropriate because: PNA, Afib    TOTAL TIME TAKING CARE OF THIS PATIENT: 35 minutes.  >50% time spent on counselling and coordination of care  Note: This dictation was prepared with Dragon dictation along with smaller phrase technology. Any transcriptional errors that result from this process are unintentional.  Melvinia Stager M.D    [facility]   CC: Primary care physician; Jonah Negus, NP

## 2024-03-19 NOTE — Progress Notes (Signed)
 Physical Therapy Treatment Patient Details Name: Gina Mcbride MRN: [id number] DOB: July 19, 1946 Today's Date: 03/19/2024   History of Present Illness Gina Mcbride is a 78yoF comes to [facility] on [facility]/17/24 with worsening cough, shortness of breath, palpitations, diarrhea, dizziness. PMH: PAF on Eliquis , HTN, COPD,    PT Comments  Pt OOB and completes x 1 lap with no AD and generally guarded gait.  After short seated rest on bed, she agrees to RW trial and completes and additional lap.  She does endorse RW feeling more stable on longer distances but attributes it to poor footwear.  At this time, she would benefit from RW for community and outside ambulation for general balance and weakness.  HR increased to 138 with mobility but returns quickly to 110's-120's with standing or seated rest.     If plan is discharge home, recommend the following: Help with stairs or ramp for entrance;A little help with walking and/or transfers   Can travel by private vehicle        Equipment Recommendations  Rolling walker (2 wheels)    Recommendations for Other Services       Precautions / Restrictions Precautions Precautions: Fall Precaution/Restrictions Comments: monitor HR     Mobility  Bed Mobility Overal bed mobility: Independent               Patient Response: Cooperative  Transfers Overall transfer level: Modified independent Equipment used: None, Rolling walker (2 wheels) Transfers: Sit to/from Stand Sit to Stand: Independent                Ambulation/Gait Ambulation/Gait assistance: Contact guard assist Gait Distance (Feet): 120 Feet Assistive device: Rolling walker (2 wheels), None Gait Pattern/deviations: Step-through pattern, Decreased step length - right, Decreased step length - left Gait velocity: dec     General Gait Details: 78' with HHA and 120' with RW   Stairs             Wheelchair Mobility     Tilt Bed Tilt Bed Patient Response:  Cooperative  Modified Rankin (Stroke Patients Only)       Balance Overall balance assessment: Needs assistance Sitting-balance support: Feet supported Sitting balance-Leahy Scale: Normal     Standing balance support: No upper extremity supported Standing balance-Leahy Scale: Fair Standing balance comment: endorses feeling better with RW                            Communication Communication Communication: No apparent difficulties  Cognition Arousal: Alert Behavior During Therapy: WFL for tasks assessed/performed   PT - Cognitive impairments: No apparent impairments                                Cueing    Exercises      General Comments        Pertinent Vitals/Pain Pain Assessment Pain Assessment: Faces Faces Pain Scale: Hurts a little bit Pain Location: abdomen Pain Descriptors / Indicators: Sore Pain Intervention(s): Monitored during session, Repositioned    Home Living                          Prior Function            PT Goals (current goals can now be found in the care plan section) Progress towards PT goals: Progressing toward goals    Frequency  Min 2X/week      PT Plan      Co-evaluation              AM-PAC PT "6 Clicks" Mobility   Outcome Measure  Help needed turning from your back to your side while in a flat bed without using bedrails?: None Help needed moving from lying on your back to sitting on the side of a flat bed without using bedrails?: None Help needed moving to and from a bed to a chair (including a wheelchair)?: None Help needed standing up from a chair using your arms (e.g., wheelchair or bedside chair)?: None Help needed to walk in hospital room?: A Little Help needed climbing 3-5 steps with a railing? : A Little 6 Click Score: 22    End of Session Equipment Utilized During Treatment: Gait belt Activity Tolerance: Patient tolerated treatment well Patient left: in bed;with  bed alarm set;with call bell/phone within reach Nurse Communication: Mobility status PT Visit Diagnosis: Difficulty in walking, not elsewhere classified (R26.2);Other abnormalities of gait and mobility (R26.89)     Time: 1006-1020 PT Time Calculation (min) (ACUTE ONLY): 14 min  Charges:    $Gait Training: 8-22 mins PT General Charges $$ ACUTE PT VISIT: 1 Visit                   Charlanne Cong, PTA 03/19/24, 11:25 AM

## 2024-03-20 DIAGNOSIS — J439 Emphysema, unspecified: Secondary | ICD-10-CM

## 2024-03-20 DIAGNOSIS — R197 Diarrhea, unspecified: Secondary | ICD-10-CM | POA: Insufficient documentation

## 2024-03-20 DIAGNOSIS — I480 Paroxysmal atrial fibrillation: Secondary | ICD-10-CM | POA: Diagnosis not present

## 2024-03-20 DIAGNOSIS — A419 Sepsis, unspecified organism: Secondary | ICD-10-CM | POA: Diagnosis not present

## 2024-03-20 DIAGNOSIS — J181 Lobar pneumonia, unspecified organism: Secondary | ICD-10-CM | POA: Diagnosis not present

## 2024-03-20 DIAGNOSIS — R6520 Severe sepsis without septic shock: Secondary | ICD-10-CM

## 2024-03-20 DIAGNOSIS — J449 Chronic obstructive pulmonary disease, unspecified: Secondary | ICD-10-CM | POA: Insufficient documentation

## 2024-03-20 MED ORDER — AMIODARONE HCL 200 MG PO TABS: ORAL_TABLET | ORAL | 0 refills | Status: AC

## 2024-03-20 MED ORDER — DOXYCYCLINE HYCLATE 100 MG PO CAPS: 100.0000 mg | ORAL_CAPSULE | Freq: Two times a day (BID) | ORAL | Status: AC

## 2024-03-20 MED ORDER — ALBUTEROL SULFATE HFA 108 (90 BASE) MCG/ACT IN AERS
2.0000 | INHALATION_SPRAY | Freq: Four times a day (QID) | RESPIRATORY_TRACT | 0 refills | Status: AC | PRN
Start: 2024-03-20 — End: ?

## 2024-03-20 MED ORDER — APIXABAN 5 MG PO TABS: 5.0000 mg | ORAL_TABLET | Freq: Two times a day (BID) | ORAL | Status: AC

## 2024-03-20 MED ORDER — ENSURE ENLIVE PO LIQD: 237.0000 mL | Freq: Two times a day (BID) | ORAL | 0 refills | Status: AC

## 2024-03-20 MED ORDER — CEFUROXIME AXETIL 500 MG PO TABS: 500.0000 mg | ORAL_TABLET | Freq: Two times a day (BID) | ORAL | 0 refills | Status: AC

## 2024-03-20 NOTE — Discharge Summary (Signed)
 Physician Discharge Summary   Patient: Gina Mcbride MRN: [id number] DOB: April 20, 1946  Admit date:     03/17/2024  Discharge date: 03/20/24  Discharge Physician: Verla Glaze   PCP: Jonah Negus, NP   Recommendations at discharge:   Follow-up PCP 5 days Follow-up cardiology 1 week Follow-up pulmonary 3 weeks  Discharge Diagnoses: Principal Problem:   Severe sepsis (HCC) Active Problems:   Lobar pneumonia (HCC)   Paroxysmal atrial fibrillation (HCC)   Weakness   Diarrhea   COPD (chronic obstructive pulmonary disease) (HCC)  Resolved Problems:   * No resolved hospital problems. *  Hospital Course: 78 y.o. female with medical history significant of PAF on Eliquis , HTN, COPD, presented with worsening of cough shortness of breath and diarrhea.   Patient started develop productive cough 4 to 5 days ago associated with shortness of breath and chills.  Started on antibiotics of Augmentin and doxycycline  on Friday, however after taking 2 doses of both antibiotics patient started to develop nausea vomiting and watery diarrhea.     Chest x-ray showed right middle lobe infiltrate.  Started on Rocephin  and kept on doxycycline .  5/21.  Patient feeling better.  Patient was changed to Omnicef and doxycycline  orally.  Stable for discharge home.  Assessment and Plan: * Severe sepsis (HCC) Present on admission with tachycardia, tachypnea, hypotension, lactic acidosis, acute respiratory failure.    Lobar pneumonia (HCC) Right lobar pneumonia.  Was on Rocephin  and doxycycline  and will discharge home on Omnicef and doxycycline   Paroxysmal atrial fibrillation [facility]) Patient had rapid ventricular rate on presentation.  Patient will go back on amiodarone  and Toprol .  Patient already on Eliquis .  COPD (chronic obstructive pulmonary disease) (HCC) Respiratory status stable.  Prescribed albuterol  inhaler as needed  Diarrhea No further diarrhea here.  Likely secondary to Augmentin as  outpatient.  Weakness Did well with PT         Consultants: None Procedures performed: None Disposition: Home Diet recommendation:  Cardiac diet DISCHARGE MEDICATION: Allergies as of 03/20/2024       Reactions   Erythromycin Base    Other Swelling   Hand contact with raw shrimp   Oxytetracycline Other (See Comments)   Mouth blisters (all -mycins) Mouth blisters (all -mycins)   Shrimp [shellfish Allergy] Swelling   Hand contact with raw shrimp        Medication List     STOP taking these medications    amoxicillin-clavulanate 875-125 MG tablet Commonly known as: AUGMENTIN       TAKE these medications    albuterol  108 (90 Base) MCG/ACT inhaler Commonly known as: VENTOLIN  HFA Inhale 2 puffs into the lungs every 6 (six) hours as needed for wheezing or shortness of breath.   amiodarone  200 MG tablet Commonly known as: PACERONE  One tab po twice a day for five days then one tablet daily afterwards   apixaban  5 MG Tabs tablet Commonly known as: ELIQUIS  Take 1 tablet (5 mg total) by mouth 2 (two) times daily. What changed: when to take this   CALCIUM  + D PO Take by mouth. lunchtime   cefUROXime  500 MG tablet Commonly known as: CEFTIN  Take 1 tablet (500 mg total) by mouth 2 (two) times daily with a meal for 3 days.   clonazePAM  1 MG tablet Commonly known as: KLONOPIN  Take 1 mg by mouth at bedtime. May take 1/2 tablet during the day if needed.   denosumab 60 MG/ML Soln injection Commonly known as: PROLIA Inject 60 mg into the  skin every 6 (six) months. Administer in upper arm, thigh, or abdomen   doxycycline  100 MG capsule Commonly known as: VIBRAMYCIN  Take 1 capsule (100 mg total) by mouth 2 (two) times daily. What changed: how to take this   feeding supplement Liqd Take 237 mLs by mouth 2 (two) times daily between meals.   metoprolol  succinate 25 MG 24 hr tablet Commonly known as: TOPROL -XL Take 25 mg by mouth daily.   prednisoLONE  acetate 1  % ophthalmic suspension Commonly known as: PRED FORTE  Place 1 drop into the left eye 2 (two) times daily.   rosuvastatin  5 MG tablet Commonly known as: CRESTOR  Take 1 tablet (5 mg total) by mouth daily.   traMADol  50 MG tablet Commonly known as: ULTRAM  Take 25-50 mg by mouth.   VITAMIN D PO Take by mouth. lunchtime        Follow-up Information     Jonah Negus, NP Follow up in 5 day(s).   Specialty: Nurse Practitioner Contact information: [address] [place] [address] [phone]         Burney Carter D, MD Follow up in 1 week(s).   Specialties: Cardiology, Internal Medicine Contact information: [address] [place] [address] [phone]         Erskin Hearing, MD Follow up in 3 week(s).   Specialty: Pulmonary Disease Contact information: [address] [place] [address] [phone]                Discharge Exam: Cleavon Curls Weights   03/17/24 0905  Weight: 50.3 kg   Physical Exam HENT:     Head: Normocephalic.  Eyes:     General: Lids are normal.     Conjunctiva/sclera: Conjunctivae normal.  Cardiovascular:     Rate and Rhythm: Normal rate and regular rhythm.     Heart sounds: Normal heart sounds, S1 normal and S2 normal.  Pulmonary:     Breath sounds: No decreased breath sounds, wheezing, rhonchi or rales.  Abdominal:     Palpations: Abdomen is soft.     Tenderness: There is no abdominal tenderness.  Musculoskeletal:     Right lower leg: No swelling.     Left lower leg: No swelling.  Skin:    General: Skin is warm.     Findings: No rash.  Neurological:     Mental Status: She is alert and oriented to person, place, and time.      Condition at discharge: stable  The results of significant diagnostics from this hospitalization (including imaging, microbiology, ancillary and laboratory) are listed below for reference.   Imaging Studies: DG Chest Portable 1 View Result Date:  03/17/2024 CLINICAL DATA:  78 year old female with shortness of breath. Recently treated with pneumonia, diarrhea since starting antibiotics now. EXAM: PORTABLE CHEST 1 VIEW COMPARISON:  CT chest 08/23/2023 and earlier. FINDINGS: Portable AP upright view at 0916 hours. Chronic large lung volumes and mild cardiomegaly are stable. Other mediastinal contours are within normal limits. Visualized tracheal air column is within normal limits. No pneumothorax, pulmonary edema, pleural effusion. Patchy and indistinct although vaguely nodular right perihilar opacity is new at the mid lung. No other acute or confluent lung opacity. Mild chronic lung base scarring. No acute osseous abnormality identified. Negative visible bowel gas. IMPRESSION: Chronic hyperinflation and cardiomegaly with patchy new right mid lung perihilar opacity since last year. Favor acute infectious exacerbation, small Bronchopneumonia. Followup PA and lateral chest X-ray is recommended in 3-4 weeks following trial of antibiotic therapy  to ensure resolution and exclude underlying malignancy. Electronically Signed   By: Marlise Simpers M.D.   On: 03/17/2024 09:30   VAS US  CAROTID Result Date: 03/08/2024 Carotid Arterial Duplex Study Patient Name:  GIANNY KILLMAN  Date of Exam:   03/05/2024 Medical Rec #: [id number]          Accession #:    [id number] Date of Birth: November 11, 1945           Patient Gender: F Patient Age:   29 years Exam Location:   [facility] Procedure:      VAS US  CAROTID Referring Phys: Mikki Alexander --------------------------------------------------------------------------------  Indications:       Carotid artery disease. Comparison Study:  08/2022 Performing Technologist: Faustine Hoof RVT  Examination Guidelines: A complete evaluation includes B-mode imaging, spectral Doppler, color Doppler, and power Doppler as needed of all accessible portions of each vessel. Bilateral testing is considered an integral part of a complete examination.  Limited examinations for reoccurring indications may be performed as noted.  Right Carotid Findings: +----------+--------+--------+--------+------------------+--------+           PSV cm/sEDV cm/sStenosisPlaque DescriptionComments +----------+--------+--------+--------+------------------+--------+ CCA Prox  64      13                                         +----------+--------+--------+--------+------------------+--------+ CCA Mid   69      16                                         +----------+--------+--------+--------+------------------+--------+ CCA Distal64      13                                         +----------+--------+--------+--------+------------------+--------+ ICA Prox  58      14      1-39%   heterogenous               +----------+--------+--------+--------+------------------+--------+ ICA Mid   61      13                                         +----------+--------+--------+--------+------------------+--------+ ICA Distal57      11                                         +----------+--------+--------+--------+------------------+--------+ ECA       76      4                                          +----------+--------+--------+--------+------------------+--------+ +----------+--------+-------+----------------+-------------------+           PSV cm/sEDV cmsDescribe        Arm Pressure (mmHG) +----------+--------+-------+----------------+-------------------+ Subclavian50             Multiphasic, WNL                    +----------+--------+-------+----------------+-------------------+ +---------+--------+--+--------+--+---------+  VertebralPSV cm/s55EDV cm/s10Antegrade +---------+--------+--+--------+--+---------+  Left Carotid Findings: +----------+--------+--------+--------+------------------+--------+           PSV cm/sEDV cm/sStenosisPlaque DescriptionComments  +----------+--------+--------+--------+------------------+--------+ CCA Prox  68      7                                          +----------+--------+--------+--------+------------------+--------+ CCA Mid   77      10                                         +----------+--------+--------+--------+------------------+--------+ CCA Distal84      14                                         +----------+--------+--------+--------+------------------+--------+ ICA Prox  76      13      1-39%   heterogenous               +----------+--------+--------+--------+------------------+--------+ ICA Mid   81      22                                         +----------+--------+--------+--------+------------------+--------+ ICA Distal77      23                                         +----------+--------+--------+--------+------------------+--------+ ECA       78      6                                          +----------+--------+--------+--------+------------------+--------+ +----------+--------+--------+----------------+-------------------+           PSV cm/sEDV cm/sDescribe        Arm Pressure (mmHG) +----------+--------+--------+----------------+-------------------+ Subclavian117             Multiphasic, WNL                    +----------+--------+--------+----------------+-------------------+ +---------+--------+--+--------+-+---------+ VertebralPSV cm/s64EDV cm/s9Antegrade +---------+--------+--+--------+-+---------+   Summary: Right Carotid: Velocities in the right ICA are consistent with a 1-39% stenosis.                Non-hemodynamically significant plaque <50% noted in the CCA. The                ECA appears <50% stenosed. Left Carotid: There is no evidence of stenosis in the left ICA.               Non-hemodynamically significant plaque <50% noted in the CCA. The               ECA appears <50% stenosed. Vertebrals:  Bilateral vertebral arteries demonstrate  antegrade flow. Subclavians: Normal flow hemodynamics were seen in bilateral subclavian              arteries. *See table(s) above for measurements and observations.  Electronically signed by Mikki Alexander MD on 03/08/2024 at 11:18:19 AM.  Final    MM 3D SCREENING MAMMOGRAM BILATERAL BREAST Result Date: 03/06/2024 CLINICAL DATA:  Screening. EXAM: DIGITAL SCREENING BILATERAL MAMMOGRAM WITH TOMOSYNTHESIS AND CAD TECHNIQUE: Bilateral screening digital craniocaudal and mediolateral oblique mammograms were obtained. Bilateral screening digital breast tomosynthesis was performed. The images were evaluated with computer-aided detection. COMPARISON:  Previous exam(s). ACR Breast Density Category b: There are scattered areas of fibroglandular density. FINDINGS: There are no findings suspicious for malignancy. IMPRESSION: No mammographic evidence of malignancy. A result letter of this screening mammogram will be mailed directly to the patient. RECOMMENDATION: Screening mammogram in one year. (Code:SM-B-01Y) BI-RADS CATEGORY  1: Negative. Electronically Signed   By: Roda Cirri M.D.   On: 03/06/2024 16:22    Microbiology: Results for orders placed or performed during the hospital encounter of 03/17/24  Blood Culture (routine x 2)     Status: None (Preliminary result)   Collection Time: 03/17/24  9:35 AM   Specimen: BLOOD  Result Value Ref Range Status   Specimen Description BLOOD LEFT ANTECUBITAL  Final   Special Requests   Final    BOTTLES DRAWN AEROBIC AND ANAEROBIC Blood Culture results may not be optimal due to an inadequate volume of blood received in culture bottles   Culture   Final    NO GROWTH 3 DAYS Performed at [facility], [address], [place], [place] [address]    Report Status PENDING  Incomplete  Blood Culture (routine x 2)     Status: None (Preliminary result)   Collection Time: 03/17/24  9:35 AM   Specimen: BLOOD  Result Value Ref Range Status   Specimen Description BLOOD  BLOOD RIGHT FOREARM  Final   Special Requests   Final    BOTTLES DRAWN AEROBIC AND ANAEROBIC Blood Culture results may not be optimal due to an inadequate volume of blood received in culture bottles   Culture   Final    NO GROWTH 3 DAYS Performed at [facility], [address] [place], [place] [address]    Report Status PENDING  Incomplete  Resp panel by RT-PCR (RSV, Flu A&B, Covid) Anterior Nasal Swab     Status: None   Collection Time: 03/17/24  9:56 AM   Specimen: Anterior Nasal Swab  Result Value Ref Range Status   SARS Coronavirus 2 by RT PCR NEGATIVE NEGATIVE Final    Comment: (NOTE) SARS-CoV-2 target nucleic acids are NOT DETECTED.  The SARS-CoV-2 RNA is generally detectable in upper respiratory specimens during the acute phase of infection. The lowest concentration of SARS-CoV-2 viral copies this assay can detect is 138 copies/mL. A negative result does not preclude SARS-Cov-2 infection and should not be used as the sole basis for treatment or other patient management decisions. A negative result may occur with  improper specimen collection/handling, submission of specimen other than nasopharyngeal swab, presence of viral mutation(s) within the areas targeted by this assay, and inadequate number of viral copies(<138 copies/mL). A negative result must be combined with clinical observations, patient history, and epidemiological information. The expected result is Negative.  Fact Sheet for Patients:  BloggerCourse.com  Fact Sheet for Healthcare Providers:  SeriousBroker.it  This test is no t yet approved or cleared by the [place]  FDA and  has been authorized for detection and/or diagnosis of SARS-CoV-2 by FDA under an Emergency Use Authorization (EUA). This EUA will remain  in effect (meaning this test can be used) for the duration of the COVID-19 declaration under Section 564(b)(1) of the Act,  21  U.S.C.section 360bbb-3(b)(1), unless the authorization is terminated  or revoked sooner.       Influenza A by PCR NEGATIVE NEGATIVE Final   Influenza B by PCR NEGATIVE NEGATIVE Final    Comment: (NOTE) The Xpert Xpress SARS-CoV-2/FLU/RSV plus assay is intended as an aid in the diagnosis of influenza from Nasopharyngeal swab specimens and should not be used as a sole basis for treatment. Nasal washings and aspirates are unacceptable for Xpert Xpress SARS-CoV-2/FLU/RSV testing.  Fact Sheet for Patients: BloggerCourse.com  Fact Sheet for Healthcare Providers: SeriousBroker.it  This test is not yet approved or cleared by the [place]  FDA and has been authorized for detection and/or diagnosis of SARS-CoV-2 by FDA under an Emergency Use Authorization (EUA). This EUA will remain in effect (meaning this test can be used) for the duration of the COVID-19 declaration under Section 564(b)(1) of the Act, 21 U.S.C. section 360bbb-3(b)(1), unless the authorization is terminated or revoked.     Resp Syncytial Virus by PCR NEGATIVE NEGATIVE Final    Comment: (NOTE) Fact Sheet for Patients: BloggerCourse.com  Fact Sheet for Healthcare Providers: SeriousBroker.it  This test is not yet approved or cleared by the [place]  FDA and has been authorized for detection and/or diagnosis of SARS-CoV-2 by FDA under an Emergency Use Authorization (EUA). This EUA will remain in effect (meaning this test can be used) for the duration of the COVID-19 declaration under Section 564(b)(1) of the Act, 21 U.S.C. section 360bbb-3(b)(1), unless the authorization is terminated or revoked.  Performed at [facility], [address], [place], [place] [address]   Urine Culture     Status: None   Collection Time: 03/17/24 10:30 PM   Specimen: Urine, Random  Result Value Ref Range Status    Specimen Description   Final    URINE, RANDOM Performed at [facility], [address], [place], [place] [address]    Special Requests   Final    NONE Reflexed from [id number] Performed at [facility], [address], [place], [place] [address]    Culture   Final    NO GROWTH Performed at [facility], [address], [place], [place] [address]    Report Status 03/19/2024 FINAL  Final    Labs: CBC: Recent Labs  Lab 03/17/24 0935 03/18/24 0142  WBC 9.5 7.0  NEUTROABS 6.6  --   HGB 17.0* 12.7  HCT 53.9* 40.3  MCV 96.1 97.1  PLT 331 246   Basic Metabolic Panel: Recent Labs  Lab 03/17/24 0935 03/18/24 0142  NA 138 137  K 4.2 3.7  CL 102 109  CO2 23 22  GLUCOSE 100* 89  BUN 34* 36*  CREATININE 1.27* 1.05*  CALCIUM  9.5 8.4*  MG 2.3  --   PHOS 4.9*  --    Liver Function Tests: Recent Labs  Lab 03/17/24 0935  AST 40  ALT 22  ALKPHOS 75  BILITOT 0.9  PROT 7.3  ALBUMIN 3.2*   CBG: No results for input(s): "GLUCAP" in the last 168 hours.  Discharge time spent: greater than 30 minutes.  Signed: Verla Glaze, MD [facility] 03/20/2024

## 2024-03-20 NOTE — Assessment & Plan Note (Signed)
 Right lobar pneumonia.  Was on Rocephin  and doxycycline  and will discharge home on Omnicef and doxycycline 

## 2024-03-20 NOTE — Assessment & Plan Note (Addendum)
 No further diarrhea here.  Likely secondary to Augmentin as outpatient.

## 2024-03-20 NOTE — Hospital Course (Signed)
 78 y.o. female with medical history significant of PAF on Eliquis , HTN, COPD, presented with worsening of cough shortness of breath and diarrhea.   Patient started develop productive cough 4 to 5 days ago associated with shortness of breath and chills.  Started on antibiotics of Augmentin and doxycycline  on Friday, however after taking 2 doses of both antibiotics patient started to develop nausea vomiting and watery diarrhea.     Chest x-ray showed right middle lobe infiltrate.  Started on Rocephin  and kept on doxycycline .  5/21.  Patient feeling better.  Patient was changed to Omnicef and doxycycline  orally.  Stable for discharge home.

## 2024-03-20 NOTE — Assessment & Plan Note (Signed)
 Respiratory status stable.  Prescribed albuterol  inhaler as needed

## 2024-03-20 NOTE — Assessment & Plan Note (Signed)
 Patient had rapid ventricular rate on presentation.  Patient will go back on amiodarone  and Toprol .  Patient already on Eliquis .

## 2024-03-20 NOTE — Assessment & Plan Note (Signed)
 Did well with PT

## 2024-03-20 NOTE — Plan of Care (Signed)
  Problem: Education: Goal: Knowledge of General Education information will improve Description: Including pain rating scale, medication(s)/side effects and non-pharmacologic comfort measures Outcome: Progressing   Problem: Health Behavior/Discharge Planning: Goal: Ability to manage health-related needs will improve Outcome: Progressing   Problem: Clinical Measurements: Goal: Respiratory complications will improve Outcome: Progressing   

## 2024-03-20 NOTE — Assessment & Plan Note (Signed)
 Present on admission with tachycardia, tachypnea, hypotension, lactic acidosis, acute respiratory failure.

## 2024-03-22 LAB — CULTURE, BLOOD (ROUTINE X 2)
Culture: NO GROWTH
Culture: NO GROWTH

## 2024-05-27 NOTE — Progress Notes (Signed)
 Rheumatology Follow Up Note  Chief Complaint  Patient presents with  . Positive Ana      Subjective:HPI  Gina Mcbride is a 78 y.o. female is here today for follow up of ANA, raynaud's and osteoporosis. The patient's allergies, current medications, past family history, past medical history, past social history, past surgical history and problem list were reviewed and updated as appropriate.   She has no new concerns today. She states that raynaud's has been stable. She tries to keep her body warm. She has no digital ulcers.   She does get occasional joint pains of the CMC joints. She has no joint swelling. She has no nasal/oral ulcer, skin rash (malar rash), pleurisy or serositis, seizure, stroke, paresthesia.   She did have Prolia in July 2024. She did get her six months in January even though it was approved. She has no falls and fracture. She is taking Calcium  and Vitamin D.   Review of Systems:   Review of Systems  Constitutional:  Negative for fatigue.  HENT:  Negative for mouth sores and trouble swallowing.        Neg: Dry Mouth  Eyes:  Positive for visual disturbance. Negative for redness.       Neg: Dry Eyes  Respiratory:  Negative for cough and shortness of breath.   Cardiovascular:  Negative for chest pain and leg swelling.  Gastrointestinal:  Negative for constipation, diarrhea and nausea.  Endocrine: Negative for cold intolerance and heat intolerance.  Genitourinary:  Negative for hematuria.  Musculoskeletal:        Per HPI  Skin:  Negative for color change and rash.  Neurological:  Negative for dizziness, weakness, numbness and headaches.  Hematological:  Does not bruise/bleed easily.  Psychiatric/Behavioral:  Negative for dysphoric mood and sleep disturbance. The patient is not nervous/anxious.   All other systems reviewed and are negative.  Objective:  Vitals:   05/27/24 1505  BP: 126/60  Weight: 50.3 kg (111 lb)  Height: 160 cm (5' 3)  PainSc: 0-No pain     Length of Stiffness: None  GEN - Pleasant, No Apparent Distress  HEENT - normocephalic and atraumatic. Conjunctiva Clear.  Neck - supple with no adenopathy or thyromegaly.   C spine with full range of motion. Heart - regular rate and rhythm, No murmurs/gallops/rub, Nml S1S2 Lungs - clear to auscultation in all fields. Extremities - there is no cyanosis or edema.  Neurological - alert and oriented.  Spine - no paraspinal tenderness; no L spine pain  Skin - no rashes observed MSK - The following joints were examined bilaterally: Hands, Wrists, Elbows, Shoulders, Metatarsals, Ankles, Knees and Hips; they were normal apart from what is noted.   100% Fist Formation CMC Squaring  No Digital Ulcers No Synovitis or Dactylitis No Tender Point  Labs/Imaging Reviewed in EMR CMP Cr 1.1, Ca 10.1, Ast 24, Alt 12 Normal CBC ESR 19; CRP < 1 Normal C3 and C4 Pos: ANA IFA, Anticardiolipin IgM (13; Indeterminate) Neg: beta-2 Glycoprotein (IgA, IgG, IgM), No lupus anticoagulant, Anticardiolipin IgG Neg: RNP, SSA, SSB, Sm, Anti-DNA (Ds) DEXA Scan (03/2022): Osteopenia (On Prolia)  US  Carotid: Right Carotid: Velocities in the right ICA are consistent with a 1-39% stenosis.                 The ECA appears <50% stenosed.   Left Carotid: Velocities in the left ICA are consistent with a 1-39% stenosis.   Vertebrals:  Bilateral vertebral arteries demonstrate antegrade flow. Subclavians: Normal  flow hemodynamics were seen in bilateral subclavian arteries.   Assessment and Plan   1. Positive ANA -- History of Raynaud's, sun sensitivity -- Keep Body Warm   2. Raynaud's -- Advised her to keep body warm  -- Stable with no digital ulcers -- Use NTG ointment as needed   3. Osteoporosis --> Osteopenia -- On Prolia  (Last inject 05/2023) -- Continue with Vitamin D and Calcium     Diagnoses and all orders for this visit:  ANA positive  Senile osteoporosis     Return in about 6 months  (around 11/27/2024) for Routine Follow Up.   All new prescription medications, changes in current prescription dosages, and sample medications were discussed with the patient, including patient education, medication name, use, dosage, potential side effects, drug interactions, consequences of not using/taking, and special instructions.  Patient expressed understanding.  No barriers to adherence.   I appreciate the opportunity to participate in the care of Gina Mcbride. Please do not hesitate to contact me with any questions or concerns that may arise in regards to the patient's rheumatologic disease.   I personally performed the service. (TP)  MAYUR LOREE BLANCH, MD

## 2024-05-29 ENCOUNTER — Ambulatory Visit: Admission: RE | Admit: 2024-05-29 | Discharge: 2024-05-29 | Disposition: A | Discharge status: Home / Self Care

## 2024-05-29 ENCOUNTER — Other Ambulatory Visit: Payer: Self-pay

## 2024-05-29 ENCOUNTER — Encounter: Admission: RE | Disposition: A | Discharge status: Home / Self Care | Payer: Self-pay | Source: Home / Self Care

## 2024-05-29 ENCOUNTER — Encounter: Payer: Self-pay | Admitting: Internal Medicine

## 2024-05-29 DIAGNOSIS — I272 Pulmonary hypertension, unspecified: Secondary | ICD-10-CM | POA: Insufficient documentation

## 2024-05-29 HISTORY — PX: RIGHT HEART CATH: CATH118263

## 2024-05-29 LAB — POCT I-STAT 7, (LYTES, BLD GAS, ICA,H+H)
Acid-Base Excess: 1 mmol/L (ref 0.0–2.0)
Acid-Base Excess: 2 mmol/L (ref 0.0–2.0)
Bicarbonate: 27.3 mmol/L (ref 20.0–28.0)
Bicarbonate: 27.5 mmol/L (ref 20.0–28.0)
Calcium, Ion: 1.3 mmol/L (ref 1.15–1.40)
Calcium, Ion: 1.3 mmol/L (ref 1.15–1.40)
HCT: 37 % (ref 36.0–46.0)
HCT: 37 % (ref 36.0–46.0)
Hemoglobin: 12.6 g/dL (ref 12.0–15.0)
Hemoglobin: 12.6 g/dL (ref 12.0–15.0)
O2 Saturation: 68 %
O2 Saturation: 68 %
Potassium: 4 mmol/L (ref 3.5–5.1)
Potassium: 4 mmol/L (ref 3.5–5.1)
Sodium: 141 mmol/L (ref 135–145)
Sodium: 141 mmol/L (ref 135–145)
TCO2: 29 mmol/L (ref 22–32)
TCO2: 29 mmol/L (ref 22–32)
pCO2 arterial: 47.9 mmHg (ref 32–48)
pCO2 arterial: 48.5 mmHg — ABNORMAL HIGH (ref 32–48)
pH, Arterial: 7.358 (ref 7.35–7.45)
pH, Arterial: 7.367 (ref 7.35–7.45)
pO2, Arterial: 37 mmHg — CL (ref 83–108)
pO2, Arterial: 37 mmHg — CL (ref 83–108)

## 2024-05-29 SURGERY — RIGHT HEART CATH
Anesthesia: Moderate Sedation | Laterality: Right

## 2024-05-29 MED ORDER — SODIUM CHLORIDE 0.9% FLUSH: 3.0000 mL | INTRAVENOUS | Status: DC | PRN

## 2024-05-29 MED ORDER — FREE WATER: 500.0000 mL | Freq: Once | Status: DC

## 2024-05-29 MED ORDER — MIDAZOLAM HCL 2 MG/2ML IJ SOLN
INTRAMUSCULAR | Status: AC
  Filled 2024-05-29: qty 2

## 2024-05-29 MED ORDER — SODIUM CHLORIDE 0.9% FLUSH: 3.0000 mL | Freq: Two times a day (BID) | INTRAVENOUS | Status: DC

## 2024-05-29 MED ORDER — FENTANYL CITRATE (PF) 100 MCG/2ML IJ SOLN
INTRAMUSCULAR | Status: DC | PRN
  Administered 2024-05-29: 25 ug via INTRAVENOUS

## 2024-05-29 MED ORDER — HEPARIN (PORCINE) IN NACL 1000-0.9 UT/500ML-% IV SOLN
INTRAVENOUS | Status: AC
  Filled 2024-05-29: qty 500

## 2024-05-29 MED ORDER — ACETAMINOPHEN 325 MG PO TABS: 650.0000 mg | ORAL_TABLET | ORAL | Status: DC | PRN

## 2024-05-29 MED ORDER — SODIUM CHLORIDE 0.9 % IV SOLN
250.0000 mL | INTRAVENOUS | Status: DC | PRN
  Administered 2024-05-29: 250 mL via INTRAVENOUS

## 2024-05-29 MED ORDER — FENTANYL CITRATE (PF) 100 MCG/2ML IJ SOLN
INTRAMUSCULAR | Status: AC
Start: 2024-05-29 — End: 2024-05-29
  Filled 2024-05-29: qty 2

## 2024-05-29 MED ORDER — HEPARIN (PORCINE) IN NACL 2000-0.9 UNIT/L-% IV SOLN
INTRAVENOUS | Status: DC | PRN
  Administered 2024-05-29: 1000 mL

## 2024-05-29 MED ORDER — MIDAZOLAM HCL 2 MG/2ML IJ SOLN
INTRAMUSCULAR | Status: DC | PRN
Start: 2024-05-29 — End: 2024-05-29
  Administered 2024-05-29: 1 mg via INTRAVENOUS

## 2024-05-29 MED ORDER — ONDANSETRON HCL 4 MG/2ML IJ SOLN: 4.0000 mg | Freq: Four times a day (QID) | INTRAMUSCULAR | Status: DC | PRN

## 2024-05-29 MED ORDER — LABETALOL HCL 5 MG/ML IV SOLN: 10.0000 mg | INTRAVENOUS | Status: DC | PRN

## 2024-05-29 MED ORDER — LIDOCAINE HCL (PF) 1 % IJ SOLN
INTRAMUSCULAR | Status: DC | PRN
  Administered 2024-05-29: 5 mL

## 2024-05-29 MED ORDER — HYDRALAZINE HCL 20 MG/ML IJ SOLN: 10.0000 mg | INTRAMUSCULAR | Status: DC | PRN

## 2024-05-29 MED ORDER — SODIUM CHLORIDE 0.9 % IV SOLN: 250.0000 mL | INTRAVENOUS | Status: DC | PRN

## 2024-05-29 MED ORDER — LIDOCAINE HCL 1 % IJ SOLN
INTRAMUSCULAR | Status: AC
Start: 2024-05-29 — End: 2024-05-29
  Filled 2024-05-29: qty 20

## 2024-05-29 SURGICAL SUPPLY — 7 items
CATH BALLN WEDGE 5F 110CM (CATHETERS) IMPLANT
DRAPE BRACHIAL (DRAPES) IMPLANT
GUIDEWIRE EMER 3M J .025X150CM (WIRE) IMPLANT
PACK CARDIAC CATH (CUSTOM PROCEDURE TRAY) ×1 IMPLANT
SET ATX-X65L (MISCELLANEOUS) IMPLANT
SHEATH GLIDE SLENDER 4/5FR (SHEATH) IMPLANT
STATION PROTECTION PRESSURIZED (MISCELLANEOUS) IMPLANT

## 2024-05-29 NOTE — Discharge Instructions (Signed)
Right Heart Cath, Care After This sheet gives you information about how to care for yourself after your procedure. Your health care provider may also give you more specific instructions. If you have problems or questions, contact your health care provider. What can I expect after the procedure? After the procedure, it is common to have: Bruising or mild discomfort in the area where the IV was inserted (insertion site). Follow these instructions at home: Eating and drinking  You may eat and drink after your procedure.  Drink a lot of fluids for the first several days after the procedure, as directed by your health care provider. This helps to wash (flush) the contrast out of your body. Examples of healthy fluids include water or low-calorie drinks. General instructions Check your IV insertion area and also your venous access site every day for signs of infection. Check for: Redness, swelling, or pain. Fluid or blood. Warmth. Pus or a bad smell. Take over-the-counter and prescription medicines only as told by your health care provider. Rest and return to your normal activities as told by your health care provider. Ask your health care provider what activities are safe for you. Do not drive for 24 hours if you were given a medicine to help you relax (sedative), or until your health care provider approves. Keep all follow-up visits as told by your health care provider. This is important. Contact a health care provider if: Your skin becomes itchy or you develop a rash or hives. You have a fever that does not get better with medicine. You feel nauseous. You vomit. You have redness, swelling, or pain around the insertion site. You have fluid or blood coming from the insertion site. Your insertion area feels warm to the touch. You have pus or a bad smell coming from the insertion site. Get help right away if: You have difficulty breathing or shortness of breath. You develop chest pain. You  faint. You feel very dizzy. These symptoms may represent a serious problem that is an emergency. Do not wait to see if the symptoms will go away. Get medical help right away. Call your local emergency services (911 in the [place] Do not drive yourself to the hospital. Summary After your procedure, it is common to have bruising or mild discomfort in the area where the IV was inserted. You should check your IV insertion area every day for signs of infection. Take over-the-counter and prescription medicines only as told by your health care provider. You should drink a lot of fluids for the first several days after the procedure to help flush the contrast from your body. This information is not intended to replace advice given to you by your health care provider. Make sure you discuss any questions you have with your health care provider. Document Released: 08/07/2013 Document Revised: 09/29/2017 Document Reviewed: 09/10/2016 [employer] Patient Education  2020 [employer] 

## 2025-03-04 ENCOUNTER — Ambulatory Visit (INDEPENDENT_AMBULATORY_CARE_PROVIDER_SITE_OTHER): Admitting: Vascular Surgery

## 2025-03-04 ENCOUNTER — Encounter (INDEPENDENT_AMBULATORY_CARE_PROVIDER_SITE_OTHER)
